# Patient Record
Sex: Male | Born: 1970
Health system: Southern US, Community
[De-identification: ages and names within clinical notes are randomized; demographics above are authoritative.]

## PROBLEM LIST (undated history)

## (undated) DIAGNOSIS — R61 Generalized hyperhidrosis: Secondary | ICD-10-CM

## (undated) HISTORY — PX: NO PAST SURGERIES: SHX2092

## (undated) HISTORY — DX: Generalized hyperhidrosis: R61

---

## 2004-09-21 ENCOUNTER — Ambulatory Visit: Payer: Self-pay | Admitting: Internal Medicine

## 2004-10-07 ENCOUNTER — Ambulatory Visit: Payer: Self-pay | Admitting: Internal Medicine

## 2004-11-08 ENCOUNTER — Ambulatory Visit: Payer: Self-pay | Admitting: Internal Medicine

## 2005-02-04 ENCOUNTER — Ambulatory Visit: Payer: Self-pay | Admitting: Internal Medicine

## 2005-08-18 ENCOUNTER — Ambulatory Visit: Payer: Self-pay | Admitting: Internal Medicine

## 2006-02-13 ENCOUNTER — Ambulatory Visit: Payer: Self-pay | Admitting: Pulmonary Disease

## 2006-03-13 ENCOUNTER — Ambulatory Visit: Payer: Self-pay | Admitting: Internal Medicine

## 2007-01-29 ENCOUNTER — Ambulatory Visit: Payer: Self-pay | Admitting: Internal Medicine

## 2007-02-26 ENCOUNTER — Ambulatory Visit: Payer: Self-pay | Admitting: Internal Medicine

## 2007-05-09 ENCOUNTER — Encounter: Payer: Self-pay | Admitting: Internal Medicine

## 2007-05-09 DIAGNOSIS — J309 Allergic rhinitis, unspecified: Secondary | ICD-10-CM | POA: Insufficient documentation

## 2007-08-16 ENCOUNTER — Ambulatory Visit: Payer: Self-pay | Admitting: Thoracic Surgery

## 2007-09-24 ENCOUNTER — Telehealth: Payer: Self-pay | Admitting: Internal Medicine

## 2007-09-24 ENCOUNTER — Emergency Department (HOSPITAL_COMMUNITY): Admission: EM | Admit: 2007-09-24 | Discharge: 2007-09-24 | Payer: Self-pay | Admitting: Family Medicine

## 2007-10-11 ENCOUNTER — Ambulatory Visit: Payer: Self-pay | Admitting: Thoracic Surgery

## 2008-05-28 ENCOUNTER — Ambulatory Visit: Payer: Self-pay | Admitting: Internal Medicine

## 2008-05-29 DIAGNOSIS — E785 Hyperlipidemia, unspecified: Secondary | ICD-10-CM | POA: Insufficient documentation

## 2009-07-16 ENCOUNTER — Ambulatory Visit: Payer: Self-pay | Admitting: Internal Medicine

## 2009-07-16 DIAGNOSIS — M6740 Ganglion, unspecified site: Secondary | ICD-10-CM | POA: Insufficient documentation

## 2010-01-20 ENCOUNTER — Ambulatory Visit: Payer: Self-pay | Admitting: Internal Medicine

## 2010-01-20 DIAGNOSIS — M25529 Pain in unspecified elbow: Secondary | ICD-10-CM | POA: Insufficient documentation

## 2010-10-12 NOTE — Miscellaneous (Signed)
Summary: [facility] El[facility]  [facility]   Imported By: Sherian Rein 01/22/2010 11:11:37  _____________________________________________________________________  External Attachment:    Type:   Image     Comment:   External Document

## 2010-10-12 NOTE — Assessment & Plan Note (Signed)
Summary: bump left elbow/#/cd   Vital Signs:  Patient profile:   40 year old male Height:      75 inches Weight:      273.13 pounds BMI:     34.26 O2 Sat:      97 % on Room air Temp:     97.4 degrees F oral Pulse rate:   81 / minute BP sitting:   110 / 64  (left arm) Cuff size:   large  Vitals Entered By: Lucious Groves (Jan 20, 2010 9:41 AM)  O2 Flow:  Room air  Procedure Note  Injections: The patient complains of pain and inflammation. Duration of symptoms: 1 week Indication: acute pain Consent signed: yes  Procedure # 1: joint aspiration & injection    Region: dorsal    Location: L elbow    Technique: 20 g needle    Medication: 20 mg depomedrol    Anesthesia: 1.0 ml 1% lidocaine w/o epinephrine    Comment: Risks including but not limited by incomplete procedure, bleeding, infection, recurrence were discussed with the patient. Consent form was signed. Tolerated well. Complicatons - none. Good pain relief following the procedure. The tap was dry.  Cleaned and prepped with: alcohol and betadine Wound dressing: bandaid Instructions: elevate and ice  CC: C/O knot/bump on left elbow x1 week. /kb Is Patient Diabetic? No Pain Assessment Patient in pain? no      Comments Patient notes that it does not limit his movement, and he does not have pain with the elbow unless he puts pressure on it./kb   CC:  C/O knot/bump on left elbow x1 week. /kb.  History of Present Illness: C/o L elbow pain and swelling x 1 wk  Current Medications (verified): 1)  Vitamin D3 1000 Unit  Tabs (Cholecalciferol) .Marland Kitchen.. 1 By Mouth Daily 2)  Pennsaid 1.5 % Soln (Diclofenac Sodium) .Marland Kitchen.. 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    Physical Exam  General:  Well-developed,well-nourished,in no acute distress; alert,appropriate and cooperative throughout examination Msk:  L elbow bursa is swollen and tender a little Skin:  not red   Impression & Recommendations:  Problem # 1:  ELBOW PAIN (ICD-719.42) L - bursitis Assessment New See "Patient Instructions".  Orders: Joint Aspirate / Injection, Intermediate (16109) Depo-Medrol 20mg  (J1020) Ace Wraps 3-5 in/yard  (U0454)  Complete Medication List: 1)  Vitamin D3 1000 Unit Tabs (Cholecalciferol) .Marland Kitchen.. 1 by mouth daily 2)  Pennsaid 1.5 % Soln (Diclofenac sodium) .Marland Kitchen.. 1-2 gtt to skin three times daily 3)  Tramadol Hcl 50 Mg Tabs (Tramadol hcl) .Marland Kitchen.. 1-2 tabs by mouth two times a day as needed pain 4)  Gas Relief Extra Strength 125 Mg Caps (Simethicone) .Marland Kitchen.. 1-2 by mouth qid as needed gas 5)  Claritin-d 12 Hour 5-120 Mg Xr12h-tab (Loratadine-pseudoephedrine) .Marland Kitchen.. 1 by mouth two times a day as needed congestion  Patient Instructions: 1)  Naprelan 1 a day w/food untill gone 2)  Call if you are not better in a reasonable amount of time or if worse.  Prescriptions: PENNSAID 1.5 % SOLN (DICLOFENAC SODIUM) 1-2 gtt to skin three times daily  #1 x 1   Entered and Authorized by:   Tresa Garter MD   Signed by:  Tresa Garter MD on 01/20/2010   Method used:   Print then Give to Patient   RxID:   [id number] VITAMIN D3 1000 UNIT  TABS (CHOLECALCIFEROL) 1 by mouth daily  #100 x 3   Entered and Authorized by:   Tresa Garter MD   Signed by:   Tresa Garter MD on 01/20/2010   Method used:   Print then Give to Patient   RxID:   [id number] CLARITIN-D 12 HOUR 5-120 MG XR12H-TAB (LORATADINE-PSEUDOEPHEDRINE) 1 by mouth two times a day as needed congestion  #60 x 3   Entered and Authorized by:   Tresa Garter MD   Signed by:   Tresa Garter MD on 01/20/2010   Method used:   Print then Give to Patient   RxID:    [id number] GAS RELIEF EXTRA STRENGTH 125 MG CAPS (SIMETHICONE) 1-2 by mouth qid as needed gas  #60 x 6   Entered and Authorized by:   Tresa Garter MD   Signed by:   Tresa Garter MD on 01/20/2010   Method used:   Print then Give to Patient   RxID:   [id number] TRAMADOL HCL 50 MG TABS (TRAMADOL HCL) 1-2 tabs by mouth two times a day as needed pain  #60 x 1   Entered and Authorized by:   Tresa Garter MD   Signed by:   Tresa Garter MD on 01/20/2010   Method used:   Print then Give to Patient   RxID:   [id number]

## 2011-01-25 NOTE — Assessment & Plan Note (Signed)
OFFICE VISIT   Hector Lee, Hector Lee  DOB:  [birth year]                                        August 16, 2007  CHART #:  [id number]   The patient was first seen in March two years ago where he consulted [place]  for a dorsal sympathectomy for hypoidrosis.  Apparently did not want to  have the surgery here and referred himself to [facility] where he underwent  bilateral dorsal sympathectomy.  He initially said that he had a good  result, but now has developed a recurrence on the left side with  continue hypoidrosis, some mild hyperhidrosis on the right side.  He  comes here for evaluation for a possible recurrent surgery on the left  side.  I asked him why he did not go down to [facility] and he said for  financial reasons.  At the time we originally talked to him, we  explained our philosophy for sympathectomy and the best I can tell at  [facility] he had just obliteration and not resection.  He does not know the  surgeon, but we will have him sign to get the operative report and  medical records before making a final decision to schedule him for redo  surgery.   PHYSICAL EXAMINATION:  VITAL SIGNS:  His blood pressure was 154/80,  pulse 78, respirations 18, and saturations were 98%.  HEART:  Regular sinus rhythm, no murmur.   I will give him a call we have discussed the situation after receiving  the medical records.   Ines Bloomer, M.D.  Electronically Signed   DPB/MEDQ  D:  08/16/2007  T:  08/17/2007  Job:  [id number]

## 2011-01-25 NOTE — Assessment & Plan Note (Signed)
OFFICE VISIT   FOXX, KLARICH  DOB:  Feb 03, 1971                                        October 11, 2007  CHART #:  [id number]   Mr. Dunnaway came for follow-up and final discussions for his possible  and another dorsal sympathectomy to me for hyperhidrosis.  I reviewed  his records at [facility] and he still says he had more sweating on the left  side, but he has had two surgeries on the left side at [facility], so this  will be the third time and I explained to him that the chances of [place]  getting a good result after two previous surgeries would not be good.  I  also feel that he is not having that much trouble with hyperhidrosis on  the left side and that he does not constantly have sweaty palms on the  left side, although it is a little more moist than the right side.  For  this reason I feel that the risk/benefit ratio is such that I would not  recommend surgery and I explained this to him.  I believe he understands  this.  I told him he might want to go back and discuss the situation  again with [facility] at [facility].   Ines Bloomer, M.D.  Electronically Signed   DPB/MEDQ  D:  10/11/2007  T:  10/12/2007  Job:  [id number]

## 2012-10-08 ENCOUNTER — Encounter: Payer: Self-pay | Admitting: Internal Medicine

## 2012-10-08 DIAGNOSIS — Z0289 Encounter for other administrative examinations: Secondary | ICD-10-CM

## 2012-11-01 ENCOUNTER — Encounter: Payer: Self-pay | Admitting: Internal Medicine

## 2013-04-05 ENCOUNTER — Encounter: Payer: Self-pay | Admitting: Internal Medicine

## 2013-04-05 ENCOUNTER — Ambulatory Visit (INDEPENDENT_AMBULATORY_CARE_PROVIDER_SITE_OTHER): Payer: Managed Care, Other (non HMO) | Admitting: Internal Medicine

## 2013-04-05 VITALS — BP 140/80 | HR 80 | Temp 99.3°F | Resp 16 | Ht 75.0 in | Wt 314.0 lb

## 2013-04-05 DIAGNOSIS — R030 Elevated blood-pressure reading, without diagnosis of hypertension: Secondary | ICD-10-CM

## 2013-04-05 DIAGNOSIS — IMO0001 Reserved for inherently not codable concepts without codable children: Secondary | ICD-10-CM | POA: Insufficient documentation

## 2013-04-05 DIAGNOSIS — E669 Obesity, unspecified: Secondary | ICD-10-CM

## 2013-04-05 DIAGNOSIS — Z0000 Encounter for general adult medical examination without abnormal findings: Secondary | ICD-10-CM | POA: Insufficient documentation

## 2013-04-05 DIAGNOSIS — E785 Hyperlipidemia, unspecified: Secondary | ICD-10-CM

## 2013-04-05 NOTE — Assessment & Plan Note (Signed)
NAS diet Loose wt 

## 2013-04-05 NOTE — Assessment & Plan Note (Signed)
Labs

## 2013-04-05 NOTE — Assessment & Plan Note (Signed)
nutr ref

## 2013-04-05 NOTE — Progress Notes (Signed)
  Subjective:    Patient ID: Hector Lee, male    DOB: February 12, 1971, 42 y.o.   MRN: [id number]  HPI  The patient is here for a wellness exam. The patient has been doing well overall without major physical or psychological issues going on lately.  Wt Readings from Last 3 Encounters:  04/05/13 314 lb (142.429 kg)  01/20/10 273 lb 2.1 oz (123.892 kg)  07/16/09 284 lb (128.822 kg)   BP Readings from Last 3 Encounters:  04/05/13 140/80  01/20/10 110/64  07/16/09 124/82     Review of Systems  Constitutional: Negative for appetite change, fatigue and unexpected weight change.  HENT: Negative for ear pain, nosebleeds, congestion, sore throat, sneezing, trouble swallowing, neck pain, dental problem and ear discharge.   Eyes: Negative for itching and visual disturbance.  Respiratory: Negative for cough.   Cardiovascular: Negative for chest pain, palpitations and leg swelling.  Gastrointestinal: Negative for nausea, diarrhea, blood in stool and abdominal distention.  Genitourinary: Negative for frequency and hematuria.  Musculoskeletal: Negative for back pain, joint swelling and gait problem.  Skin: Negative for rash.  Neurological: Negative for dizziness, tremors, speech difficulty and weakness.  Psychiatric/Behavioral: Negative for suicidal ideas, confusion, sleep disturbance, dysphoric mood and agitation. The patient is not nervous/anxious.        Objective:   Physical Exam  Constitutional: He is oriented to person, place, and time. He appears well-developed. No distress.  NAD  HENT:  Mouth/Throat: Oropharynx is clear and moist.  Eyes: Conjunctivae are normal. Pupils are equal, round, and reactive to light. Left eye exhibits no discharge. No scleral icterus.  Neck: Normal range of motion. No JVD present. No thyromegaly present.  Cardiovascular: Normal rate, regular rhythm, normal heart sounds and intact distal pulses.  Exam reveals no gallop and no friction rub.   No murmur  heard. Pulmonary/Chest: Effort normal and breath sounds normal. No respiratory distress. He has no wheezes. He has no rales. He exhibits no tenderness.  Abdominal: Soft. Bowel sounds are normal. He exhibits no distension and no mass. There is no tenderness. There is no rebound and no guarding.  Musculoskeletal: Normal range of motion. He exhibits no edema and no tenderness.  Lymphadenopathy:    He has no cervical adenopathy.  Neurological: He is alert and oriented to person, place, and time. He has normal reflexes. No cranial nerve deficit. He exhibits normal muscle tone. He displays a negative Romberg sign. Coordination and gait normal.  No meningeal signs  Skin: Skin is warm and dry. No rash noted. No pallor.  Psychiatric: He has a normal mood and affect. His behavior is normal. Judgment and thought content normal.    No results found for this basename: WBC, HGB, HCT, PLT, GLUCOSE, CHOL, TRIG, HDL, LDLDIRECT, LDLCALC, ALT, AST, NA, K, CL, CREATININE, BUN, CO2, TSH, PSA, INR, GLUF, HGBA1C, MICROALBUR         Assessment & Plan:

## 2013-04-05 NOTE — Assessment & Plan Note (Signed)
We discussed age appropriate health related issues, including available/recomended screening tests and vaccinations. We discussed a need for adhering to healthy diet and exercise. Labs/EKG were reviewed/ordered. All questions were answered.   

## 2013-04-08 ENCOUNTER — Ambulatory Visit: Payer: Self-pay | Admitting: Internal Medicine

## 2013-04-09 ENCOUNTER — Other Ambulatory Visit (INDEPENDENT_AMBULATORY_CARE_PROVIDER_SITE_OTHER): Payer: Managed Care, Other (non HMO)

## 2013-04-09 DIAGNOSIS — Z0000 Encounter for general adult medical examination without abnormal findings: Secondary | ICD-10-CM

## 2013-04-09 LAB — URINALYSIS
Bilirubin Urine: NEGATIVE
Hgb urine dipstick: NEGATIVE
Ketones, ur: NEGATIVE
Leukocytes, UA: NEGATIVE
Urine Glucose: NEGATIVE
Urobilinogen, UA: 0.2 (ref 0.0–1.0)

## 2013-04-09 LAB — CBC WITH DIFFERENTIAL/PLATELET
Basophils Absolute: 0 10*3/uL (ref 0.0–0.1)
Basophils Relative: 0.3 % (ref 0.0–3.0)
Eosinophils Absolute: 0.2 10*3/uL (ref 0.0–0.7)
Hemoglobin: 14.9 g/dL (ref 13.0–17.0)
Lymphs Abs: 2.2 10*3/uL (ref 0.7–4.0)
MCHC: 33.2 g/dL (ref 30.0–36.0)
MCV: 88.4 fl (ref 78.0–100.0)
Monocytes Absolute: 0.7 10*3/uL (ref 0.1–1.0)
Neutro Abs: 3 10*3/uL (ref 1.4–7.7)
RBC: 5.07 Mil/uL (ref 4.22–5.81)
RDW: 14.9 % — ABNORMAL HIGH (ref 11.5–14.6)

## 2013-04-09 LAB — LIPID PANEL
Cholesterol: 237 mg/dL — ABNORMAL HIGH (ref 0–200)
HDL: 37 mg/dL — ABNORMAL LOW (ref >39.00)
VLDL: 25.8 mg/dL (ref 0.0–40.0)

## 2013-04-09 LAB — HEPATIC FUNCTION PANEL
Albumin: 4.2 g/dL (ref 3.5–5.2)
Total Protein: 7.8 g/dL (ref 6.0–8.3)

## 2013-04-09 LAB — BASIC METABOLIC PANEL
Chloride: 103 mEq/L (ref 96–112)
Potassium: 4.2 mEq/L (ref 3.5–5.1)

## 2013-04-09 LAB — TSH: TSH: 1.25 u[IU]/mL (ref 0.35–5.50)

## 2013-04-12 ENCOUNTER — Other Ambulatory Visit: Payer: Self-pay | Admitting: *Deleted

## 2013-04-12 DIAGNOSIS — E785 Hyperlipidemia, unspecified: Secondary | ICD-10-CM

## 2013-04-24 ENCOUNTER — Telehealth: Payer: Self-pay | Admitting: Internal Medicine

## 2013-04-24 NOTE — Telephone Encounter (Signed)
The form was faxed on July 30.  I re-faxed it today.  Pt is aware.

## 2013-04-24 NOTE — Telephone Encounter (Signed)
Pt left a form at his last appt.  He needs it faxed to his insurance for his discount.  It must be sent in by the end of this month.  He gave the form to Dr. Posey Rea.  Has it been faxed to [facility] yet?

## 2013-06-15 ENCOUNTER — Emergency Department (HOSPITAL_COMMUNITY)
Admission: EM | Admit: 2013-06-15 | Discharge: 2013-06-15 | Disposition: A | Discharge status: Home / Self Care | Payer: Managed Care, Other (non HMO) | Source: Home / Self Care | Attending: Family Medicine | Admitting: Family Medicine

## 2013-06-15 ENCOUNTER — Encounter (HOSPITAL_COMMUNITY): Payer: Self-pay | Admitting: Emergency Medicine

## 2013-06-15 DIAGNOSIS — R21 Rash and other nonspecific skin eruption: Secondary | ICD-10-CM

## 2013-06-15 MED ORDER — TRIAMCINOLONE ACETONIDE 0.1 % EX CREA: TOPICAL_CREAM | Freq: Two times a day (BID) | CUTANEOUS | Status: DC

## 2013-06-15 NOTE — ED Notes (Signed)
Pt c/o rash onset 2 weeks on RLQ Sxs include: itchiness Denies: fevers Has tried OTC topical creams w/no relief Alert w/no signs of acute distress.

## 2013-06-15 NOTE — ED Provider Notes (Signed)
Hector Lee is a 42 y.o. male who presents to Urgent Care today for right anterior abdominal wall macular itchy rash present off and on for the last 2 weeks. Patient tried over-the-counter hydrocortisone cream which has helped a bit. No fevers or chills. No flulike illness. No recent medications. No involvement elsewhere. No injury.   Past Medical History  Diagnosis Date  . Hyperhydrosis disorder    History  Substance Use Topics  . Smoking status: Never Smoker   . Smokeless tobacco: Not on file  . Alcohol Use: Yes   ROS as above Medications reviewed. No current facility-administered medications for this encounter.   Current Outpatient Prescriptions  Medication Sig Dispense Refill  . triamcinolone cream (KENALOG) 0.1 % Apply topically 2 (two) times daily.  60 g  0    Exam:  BP 118/76  Pulse 79  Temp(Src) 98.6 F (37 C) (Oral)  Resp 16  SpO2 100% Gen: Well NAD SKIN: Hypopigmented macular patch approximately 3 x 2 cm oval in shape on the lower right abdominal wall. Nontender to the touch. Nonblanching.  No results found for this or any previous visit (from the past 24 hour(s)). No results found.  Assessment and Plan: 42 y.o. male with likely contact dermatitis. However this is difficult to ascertain as patient has used hydrocortisone cream which masks the appearance of the rash. Plan to treat with triamcinolone cream. If not improved followup with primary care provider. Discussed warning signs or symptoms. Please see discharge instructions. Patient expresses understanding.      Rodolph Bong, MD 06/15/13 [phone]

## 2016-02-13 ENCOUNTER — Inpatient Hospital Stay (HOSPITAL_COMMUNITY)
Admission: EM | Admit: 2016-02-13 | Discharge: 2016-02-16 | DRG: 854 | Disposition: A | Discharge status: Home / Self Care | Payer: BLUE CROSS/BLUE SHIELD | Attending: Internal Medicine | Admitting: Internal Medicine

## 2016-02-13 ENCOUNTER — Encounter (HOSPITAL_COMMUNITY): Payer: Self-pay | Admitting: Emergency Medicine

## 2016-02-13 ENCOUNTER — Emergency Department (HOSPITAL_COMMUNITY): Payer: BLUE CROSS/BLUE SHIELD

## 2016-02-13 DIAGNOSIS — A419 Sepsis, unspecified organism: Secondary | ICD-10-CM | POA: Diagnosis not present

## 2016-02-13 DIAGNOSIS — N189 Chronic kidney disease, unspecified: Secondary | ICD-10-CM

## 2016-02-13 DIAGNOSIS — J0120 Acute ethmoidal sinusitis, unspecified: Secondary | ICD-10-CM | POA: Diagnosis present

## 2016-02-13 DIAGNOSIS — N179 Acute kidney failure, unspecified: Secondary | ICD-10-CM | POA: Diagnosis not present

## 2016-02-13 DIAGNOSIS — E1122 Type 2 diabetes mellitus with diabetic chronic kidney disease: Secondary | ICD-10-CM | POA: Diagnosis present

## 2016-02-13 DIAGNOSIS — I10 Essential (primary) hypertension: Secondary | ICD-10-CM

## 2016-02-13 DIAGNOSIS — L03213 Periorbital cellulitis: Secondary | ICD-10-CM | POA: Diagnosis not present

## 2016-02-13 DIAGNOSIS — H578 Other specified disorders of eye and adnexa: Secondary | ICD-10-CM | POA: Diagnosis not present

## 2016-02-13 DIAGNOSIS — N182 Chronic kidney disease, stage 2 (mild): Secondary | ICD-10-CM | POA: Diagnosis present

## 2016-02-13 DIAGNOSIS — H40051 Ocular hypertension, right eye: Secondary | ICD-10-CM | POA: Diagnosis present

## 2016-02-13 DIAGNOSIS — R509 Fever, unspecified: Secondary | ICD-10-CM | POA: Diagnosis not present

## 2016-02-13 DIAGNOSIS — N289 Disorder of kidney and ureter, unspecified: Secondary | ICD-10-CM

## 2016-02-13 DIAGNOSIS — H05011 Cellulitis of right orbit: Secondary | ICD-10-CM | POA: Diagnosis present

## 2016-02-13 DIAGNOSIS — J0110 Acute frontal sinusitis, unspecified: Secondary | ICD-10-CM | POA: Diagnosis present

## 2016-02-13 LAB — I-STAT CHEM 8, ED
BUN: 16 mg/dL (ref 6–20)
Calcium, Ion: 1.21 mmol/L (ref 1.12–1.23)
Chloride: 102 mmol/L (ref 101–111)
Creatinine, Ser: 1.5 mg/dL — ABNORMAL HIGH (ref 0.61–1.24)
Glucose, Bld: 101 mg/dL — ABNORMAL HIGH (ref 65–99)
HCT: 46 % (ref 39.0–52.0)
Hemoglobin: 15.6 g/dL (ref 13.0–17.0)
Potassium: 4.4 mmol/L (ref 3.5–5.1)
Sodium: 141 mmol/L (ref 135–145)
TCO2: 29 mmol/L (ref 0–100)

## 2016-02-13 LAB — CBC WITH DIFFERENTIAL/PLATELET
Basophils Absolute: 0 10*3/uL (ref 0.0–0.1)
Basophils Relative: 0 %
Eosinophils Absolute: 0 10*3/uL (ref 0.0–0.7)
Eosinophils Relative: 0 %
HCT: 44.1 % (ref 39.0–52.0)
Hemoglobin: 14.1 g/dL (ref 13.0–17.0)
Lymphocytes Relative: 12 %
Lymphs Abs: 1.2 10*3/uL (ref 0.7–4.0)
MCH: 28.2 pg (ref 26.0–34.0)
MCHC: 32 g/dL (ref 30.0–36.0)
MCV: 88.2 fL (ref 78.0–100.0)
Monocytes Absolute: 1.2 10*3/uL — ABNORMAL HIGH (ref 0.1–1.0)
Monocytes Relative: 12 %
Neutro Abs: 7.7 10*3/uL (ref 1.7–7.7)
Neutrophils Relative %: 76 %
Platelets: 185 10*3/uL (ref 150–400)
RBC: 5 MIL/uL (ref 4.22–5.81)
RDW: 14.8 % (ref 11.5–15.5)
WBC: 10.1 10*3/uL (ref 4.0–10.5)

## 2016-02-13 MED ORDER — POLYETHYLENE GLYCOL 3350 17 G PO PACK: 17.0000 g | PACK | Freq: Every day | ORAL | Status: DC | PRN

## 2016-02-13 MED ORDER — SODIUM CHLORIDE 0.9 % IV BOLUS (SEPSIS)
1000.0000 mL | Freq: Once | INTRAVENOUS | Status: AC
  Administered 2016-02-13: 1000 mL via INTRAVENOUS

## 2016-02-13 MED ORDER — VANCOMYCIN HCL 10 G IV SOLR
2500.0000 mg | Freq: Once | INTRAVENOUS | Status: AC
  Administered 2016-02-13: 2500 mg via INTRAVENOUS
  Filled 2016-02-13: qty 2500

## 2016-02-13 MED ORDER — IOPAMIDOL (ISOVUE-300) INJECTION 61%
INTRAVENOUS | Status: AC
  Administered 2016-02-13: 75 mL via INTRAVENOUS
  Filled 2016-02-13: qty 75

## 2016-02-13 MED ORDER — HYDROCODONE-ACETAMINOPHEN 5-325 MG PO TABS
1.0000 | ORAL_TABLET | Freq: Four times a day (QID) | ORAL | Status: DC | PRN
  Administered 2016-02-13 – 2016-02-14 (×5): 1 via ORAL
  Administered 2016-02-14 – 2016-02-16 (×5): 2 via ORAL
  Filled 2016-02-13: qty 1
  Filled 2016-02-13: qty 2
  Filled 2016-02-13: qty 1
  Filled 2016-02-13: qty 2
  Filled 2016-02-13 (×2): qty 1
  Filled 2016-02-13 (×3): qty 2
  Filled 2016-02-13: qty 1

## 2016-02-13 MED ORDER — BISACODYL 5 MG PO TBEC: 5.0000 mg | DELAYED_RELEASE_TABLET | Freq: Every day | ORAL | Status: DC | PRN

## 2016-02-13 MED ORDER — ONDANSETRON HCL 4 MG PO TABS: 4.0000 mg | ORAL_TABLET | Freq: Four times a day (QID) | ORAL | Status: DC | PRN

## 2016-02-13 MED ORDER — DORZOLAMIDE HCL-TIMOLOL MAL 2-0.5 % OP SOLN
1.0000 [drp] | Freq: Two times a day (BID) | OPHTHALMIC | Status: DC
  Administered 2016-02-13 – 2016-02-16 (×7): 1 [drp] via OPHTHALMIC
  Filled 2016-02-13: qty 10

## 2016-02-13 MED ORDER — ONDANSETRON HCL 4 MG/2ML IJ SOLN: 4.0000 mg | Freq: Four times a day (QID) | INTRAMUSCULAR | Status: DC | PRN

## 2016-02-13 MED ORDER — VANCOMYCIN HCL IN DEXTROSE 1-5 GM/200ML-% IV SOLN
1000.0000 mg | Freq: Two times a day (BID) | INTRAVENOUS | Status: DC
  Administered 2016-02-14 – 2016-02-16 (×5): 1000 mg via INTRAVENOUS
  Filled 2016-02-13 (×6): qty 200

## 2016-02-13 MED ORDER — ACETAMINOPHEN 325 MG PO TABS
650.0000 mg | ORAL_TABLET | Freq: Four times a day (QID) | ORAL | Status: DC | PRN
  Administered 2016-02-13: 650 mg via ORAL
  Filled 2016-02-13: qty 2

## 2016-02-13 MED ORDER — ACETAMINOPHEN 650 MG RE SUPP: 650.0000 mg | Freq: Four times a day (QID) | RECTAL | Status: DC | PRN

## 2016-02-13 MED ORDER — SODIUM CHLORIDE 0.9 % IV SOLN
3.0000 g | Freq: Once | INTRAVENOUS | Status: AC
  Administered 2016-02-13: 3 g via INTRAVENOUS
  Filled 2016-02-13: qty 3

## 2016-02-13 MED ORDER — ENOXAPARIN SODIUM 40 MG/0.4ML ~~LOC~~ SOLN
40.0000 mg | SUBCUTANEOUS | Status: DC
  Administered 2016-02-13: 40 mg via SUBCUTANEOUS
  Filled 2016-02-13: qty 0.4

## 2016-02-13 MED ORDER — SODIUM CHLORIDE 0.9 % IV SOLN
1.5000 g | Freq: Four times a day (QID) | INTRAVENOUS | Status: DC
  Administered 2016-02-13 – 2016-02-16 (×12): 1.5 g via INTRAVENOUS
  Filled 2016-02-13 (×15): qty 1.5

## 2016-02-13 NOTE — Consult Note (Addendum)
Reason for Consult: Acute sinusitis and orbital cellulitis Referring Physician: Charlestine Nighthristopher Lawyer, PA-C  HPI:  Hector Lee is an 45 y.o. male male who [facility] today complaining of swollen right eye.  A few days ago patient developed some nasal congestion and scratchy voice. He has a history of environmental/seasonal allergies. Around 3 AM this morning, the patient woke up with discomfort in right eye. The right eye was markedly swollen. Denies recent trauma to the eye. He does have a slight frontal headache today.  Past Medical History  Diagnosis Date  . Hyperhydrosis disorder     Past Surgical History  Procedure Laterality Date  . No past surgeries      Family History  Problem Relation Age of Onset  . Diabetes      grandmother    Social History:  reports that he has never smoked. He does not have any smokeless tobacco history on file. He reports that he drinks alcohol. He reports that he does not use illicit drugs.  Allergies: No Known Allergies  Prior to Admission medications   Medication Sig Start Date End Date Taking? Authorizing Provider  aspirin-sod bicarb-citric acid (ALKA-SELTZER) 325 MG TBEF tablet Take 325 mg by mouth every 6 (six) hours as needed.   Yes Historical Provider, MD  tetrahydrozoline 0.05 % ophthalmic solution Place 1 drop into both eyes daily as needed (dry eyes).   Yes Historical Provider, MD    Medications:  I have reviewed the patient's current medications. Scheduled: . ampicillin-sulbactam (UNASYN) IV  1.5 g Intravenous Q6H  . dorzolamide-timolol  1 drop Right Eye BID  . enoxaparin (LOVENOX) injection  40 mg Subcutaneous Q24H  . [START ON 02/14/2016] vancomycin  1,000 mg Intravenous Q12H   WUJ:[id number]:acetaminophen **OR** acetaminophen, bisacodyl, HYDROcodone-acetaminophen, ondansetron **OR** ondansetron (ZOFRAN) IV, polyethylene glycol  Results for orders placed or performed during the hospital encounter of 02/13/16 (from the past 48  hour(s))  I-stat Chem 8, ED     Status: Abnormal   Collection Time: 02/13/16  8:44 AM  Result Value Ref Range   Sodium 141 135 - 145 mmol/L   Potassium 4.4 3.5 - 5.1 mmol/L   Chloride 102 101 - 111 mmol/L   BUN 16 6 - 20 mg/dL   Creatinine, Ser 1.301.50 (H) 0.61 - 1.24 mg/dL   Glucose, Bld 865101 (H) 65 - 99 mg/dL   Calcium, Ion 7.841.21 6.961.12 - 1.23 mmol/L   TCO2 29 0 - 100 mmol/L   Hemoglobin 15.6 13.0 - 17.0 g/dL   HCT 29.546.0 28.439.0 - 13.252.0 %  CBC with Differential     Status: Abnormal   Collection Time: 02/13/16  9:40 AM  Result Value Ref Range   WBC 10.1 4.0 - 10.5 K/uL   RBC 5.00 4.22 - 5.81 MIL/uL   Hemoglobin 14.1 13.0 - 17.0 g/dL   HCT 44.044.1 10.239.0 - 72.552.0 %   MCV 88.2 78.0 - 100.0 fL   MCH 28.2 26.0 - 34.0 pg   MCHC 32.0 30.0 - 36.0 g/dL   RDW 36.614.8 44.011.5 - 34.715.5 %   Platelets 185 150 - 400 K/uL   Neutrophils Relative % 76 %   Neutro Abs 7.7 1.7 - 7.7 K/uL   Lymphocytes Relative 12 %   Lymphs Abs 1.2 0.7 - 4.0 K/uL   Monocytes Relative 12 %   Monocytes Absolute 1.2 (H) 0.1 - 1.0 K/uL   Eosinophils Relative 0 %   Eosinophils Absolute 0.0 0.0 - 0.7 K/uL  Basophils Relative 0 %   Basophils Absolute 0.0 0.0 - 0.1 K/uL    Ct Orbits W/cm  02/13/2016  CLINICAL DATA:  Right periorbital swelling. Nasal and sinus congestion. EXAM: CT ORBITS WITH CONTRAST TECHNIQUE: Multidetector CT imaging of the orbits was performed following the bolus administration of intravenous contrast. CONTRAST:  75 mL Isovue-300 COMPARISON:  None. FINDINGS: The right frontal sinus and anterior right ethmoid air cells are completely opacified. There is osseous dehiscence involving the inferolateral wall of the right frontal sinus and lamina papyracea. Soft tissue and stranding extends from the sinuses into the superior and medial extraconal space of the right orbit. No organized abscess is identified. There is mild right proptosis with mild stretching of the right optic nerve. There is also mild-to-moderate right preseptal  periorbital soft tissue swelling. Moderate, partially polypoid left maxillary sinus mucosal thickening is partially visualized. There is also mild left frontal, left ethmoid, and bilateral sphenoid sinus mucosal thickening. No sinus air-fluid levels are present. There is at most a trace left mastoid effusion. The visualized right mastoid air cells are clear. The visualized portion of the brain is unremarkable. No epidural collection is identified adjacent to the right-sided sinus disease. IMPRESSION: Right orbital postseptal cellulitis from extension of right frontal and right ethmoid sinus disease. No orbital abscess identified. These results were called by telephone at the time of interpretation on 02/13/2016 at 9:17 am to Apolonio Schneiders, who verbally acknowledged these results. Electronically Signed   By: Sebastian Ache M.D.   On: 02/13/2016 09:20   Review of Systems: As per HPI, otherwise 10 point review of systems negative.   Blood pressure 150/78, pulse 82, temperature 99.9 F (37.7 C), temperature source Oral, resp. rate 18, height 6\' 3"  (1.905 m), weight 136.079 kg (300 lb), SpO2 100 %.  General: Alert and oriented. Eyes: Marked edema of right upper lid. Left pupil, conjunctiva and lid normal. Vision grossly intact bilaterally. PERRL, slightly dimished right extraocular motion. Ears: Normal auricles and EACs. Nose: Moist and congested mucosa bilaterally. Mouth: Mucous membranes are moist. Posterior pharynx clear of any exudate or lesions.Normal dentition.  Neck: normal, supple, no masses Respiratory: clear to auscultation bilaterally, no wheezing, no crackles. Normal respiratory effort. No accessory muscle use.  Cardiovascular: Regular rate and rhythm.   Musculoskeletal: no clubbing / cyanosis. No joint deformity upper and lower extremities. Good ROM, no contractures. Normal muscle tone.  Skin: no rashes, lesions, ulcers.  Neurologic: CN 2-12 grossly intact. Sensation intact, Strength 5/5  in all 4.  Psychiatric: Normal judgment and insight. Alert and oriented x 3. Normal mood.   Procedure:  Flexible Fiberoptic Nasal Endoscopy Anesthesia: Topical oxymetazoline and lidocaine Description: Risks, benefits, and alternatives of flexible endoscopy were explained to the patient.  Specific mention was made of the risk of throat numbness with difficulty swallowing, possible bleeding from the nose and mouth, and pain from the procedure.  The patient gave oral consent to proceed.  The nasal cavities were decongested and anesthetised with a combination of oxymetazoline and 4% lidocaine solution.  The flexible scope was inserted into the right nasal cavity.  Endoscopy of the inferior and middle meatus was performed.  No polyp, mass, or lesion was appreciated. Purulent drainage noted. Olfactory cleft was clear.  Nasopharynx was clear.  Turbinates were without mass.  The procedure was repeated on the contralateral side. No purulent drainage on the left.  The patient tolerated the procedure well.  Instructions were given to avoid eating or drinking for 2 hours.  Assessment/Plan: Right orbital cellulitis, secondary to acute right frontal and ethmoid sinusitis. No obvious abscess on the CT scan. Agree with IV abx. Will follow.  Hector Lee,SUI W 02/13/2016, 10:24 PM

## 2016-02-13 NOTE — ED Notes (Signed)
MD Merrell at the bedside  

## 2016-02-13 NOTE — Consult Note (Signed)
Ophthalmology Initial Consult Note  Noble, Cicalese, 45 y.o. male Date of Service:  02/13/2016  Requesting physician: No att. providers found  Information Obtained from: patient Chief Complaint:  Right eyelid swellign  HPI/Discussion:  Hector Lee is a 45 y.o. male with longstanding sinus disease who presents with rapidly progressing right eyelid swelling and blurry vision OD over the past 24 hours. He underwent formal imaging upon arrival to the ED, which was notable for sinus disease as well as definite post-septal extension of infection on the right. He was started empirically on vanc/Unasyn and admitted for further evaluation. Ophthalmology was consulted to evaluate the patient.  Past Ocular Hx:  Denies Ocular Meds:  None Family ocular history: Denies  Past Medical History  Diagnosis Date  . Hyperhydrosis disorder    History reviewed. No pertinent past surgical history.  Prior to Admission Meds: Prescriptions prior to admission  Medication Sig Dispense Refill Last Dose  . aspirin-sod bicarb-citric acid (ALKA-SELTZER) 325 MG TBEF tablet Take 325 mg by mouth every 6 (six) hours as needed.   Past Month at Unknown time  . tetrahydrozoline 0.05 % ophthalmic solution Place 1 drop into both eyes daily as needed (dry eyes).   Past Month at Unknown time    Inpatient Meds: @  No Known Allergies Social History  Substance Use Topics  . Smoking status: Never Smoker   . Smokeless tobacco: Not on file  . Alcohol Use: Yes   Family History  Problem Relation Age of Onset  . Diabetes      grandmother    ROS: Other than ROS in the HPI, all other systems were negative.  Exam: Temp: (!) 100.4 F (38 C) Pulse Rate: 82 BP: (!) 150/78 mmHg Resp: 18 SpO2: 100 %  Visual Acuity:  near   OD 20/200   OS 20/25      OD OS  Confr Vis Fields Grossly full, but difficult to evaluate Full  EOM (Primary) -3 limitation in all gazes Full  Lids/Lashes 3+ upper and lower lid  edema Normal  Conjunctiva 2-3+ diffuse chemosis White, quiet  Adnexa  Normal  Normal  Pupils  4 --> 2, somewhat sluggish but reactive, trace rAPD 4 --> 2, brisk, no rAPD  Cornea  Clear Clear  Anterior Chamber Formed Formed  Lens:  Clear Clear  IOP (tonopen) 41 19  Fundus - Dilated? YES   Optic Disc - C:D Ratio 0.4 0.4                     Appearance  Pink, sharp margins, no edema Pink, sharp margins, no edema  Post Seg:  Retina                    Vessels Normal caliber Normal caliber                  Vitreous  Clear Clear                  Macula Good foveal reflex Good foveal reflex                  Periphery No holes or tears No holes or tears       Neuro:  Oriented to person, place, and time:  Yes Psychiatric:  Mood and Affect Appropriate:  Yes  Labs/imaging:  CT orbits: The right frontal sinus and anterior right ethmoid air cells are completely opacified. There is osseous dehiscence involving the inferolateral wall of  the right frontal sinus and lamina papyracea. Soft tissue and stranding extends from the sinuses into the superior and medial extraconal space of the right orbit. No organized abscess is identified. There is mild right proptosis with mild stretching of the right optic nerve. There is also mild-to-moderate right preseptal periorbital soft tissue swelling.  Moderate, partially polypoid left maxillary sinus mucosal thickening is partially visualized. There is also mild left frontal, left ethmoid, and bilateral sphenoid sinus mucosal thickening. No sinus air-fluid levels are present. There is at most a trace left mastoid effusion. The visualized right mastoid air cells are clear. The visualized portion of the brain is unremarkable. No epidural collection is identified adjacent to the right-sided sinus disease.  A/P:  45 y.o. male with history of sinus disease presents with 1-day history of rapidly progressive swelling right eyelids --> right orbital  cellulitis.  1) Right orbital cellulitis - Likely extension of underlying sinus disease. - There is obvious clinical and radiographic evidence of post-septal extension. - No clear abscess on my read or formal read. - Recommend broad-spectrum IV antibiotics (agree with vanc/Unasyn) with close observation. - Recommend treating IOP, as it is elevated 2/2 compression --> will start Cosopt BID OD.  2) Ocular hypertension OD - As above.  I will reevaluate the patient tomorrow for clinical improvement/worsening. If there is any concern for worsening, I would recommend repeat imaging to better evaluate for an abscess. If an abscess was at that point noted and in the implied context of worsening, I would [facility] by an Public librarianoculoplastics specialist. In the interim, I would definitely recommend formal ENT or plastics consult (depending on who covers these matters).  Baldwin Jamaica Scott Myelle Poteat, MD 02/13/2016, 1:44 PM

## 2016-02-13 NOTE — ED Notes (Signed)
Pt being transported upstairs by Marchelle FolksAmanda, EMT

## 2016-02-13 NOTE — H&P (Signed)
History and Physical    Hector Lee Houseworth [id number] DOB: 08-Jul-1971 DOA: 02/13/2016  PCP: Hector BarreJames John, MD  Patient coming from:    home    Chief Complaint: Right eye swelling  HPI: Hector Lee is a 45 y.o. male with no significant past medical history . A few days ago patient developed some nasal congestion, scratchy voice. Yesterday wife noticed right eye was slightly red which is not unusual for patient. He does give a history of environmental/seasonal allergies. Around 3 AM patient woke up with discomfort in right eye. The right eye was markedly swollen. Denies recent trauma to the eye. He does have a slight frontal headache today.  ED Course:  Temp 100.6, hemodynamically stable Normal white count CT of orbit shows right orbital post-septal cellulitis from extension of right frontal and right ethmoid sinus disease. Unasyn and vancomycin 1000 mL normal saline bolus  Review of Systems: As per HPI, otherwise 10 point review of systems negative.    Past Medical History  Diagnosis Date  . Hyperhydrosis disorder    PSH: no surgeries   reports that he has never smoked. He does not have any smokeless tobacco history on file. He reports that he drinks alcohol. He reports that he does not use illicit drugs.  No Known Allergies  FMH: diabetes in grandmother  Prior to Admission medications   Medication Sig Start Date End Date Taking? Authorizing Provider  aspirin-sod bicarb-citric acid (ALKA-SELTZER) 325 MG TBEF tablet Take 325 mg by mouth every 6 (six) hours as needed.   Yes Historical Provider, MD  tetrahydrozoline 0.05 % ophthalmic solution Place 1 drop into both eyes daily as needed (dry eyes).   Yes Historical Provider, MD   Physical Exam: Filed Vitals:   02/13/16 0930 02/13/16 1000 02/13/16 1017 02/13/16 1051  BP: 143/84 151/75  145/83  Pulse: 76 93  88  Temp:    100.6 F (38.1 C)  TempSrc:    Oral  Resp:    18  Weight:   136.079 kg (300 lb)   SpO2: 95% 97%   99%    Constitutional:  Pleasant, well-developed black male in NAD, calm, comfortable Filed Vitals:   02/13/16 0930 02/13/16 1000 02/13/16 1017 02/13/16 1051  BP: 143/84 151/75  145/83  Pulse: 76 93  88  Temp:    100.6 F (38.1 C)  TempSrc:    Oral  Resp:    18  Weight:   136.079 kg (300 lb)   SpO2: 95% 97%  99%   Eyes: Markedly edema of right upper lid . Occasional thin drainage coming from outer corner of eye . Unable to retract lid for adequate examination of eye  No surrounding lymphadenopathy. Left pupil, conjunctiva and lid normal.  ENMT: Mucous membranes are moist. Posterior pharynx clear of any exudate or lesions.Normal dentition.  Neck: normal, supple, no masses Respiratory: clear to auscultation bilaterally, no wheezing, no crackles. Normal respiratory effort. No accessory muscle use.  Cardiovascular: Regular rate and rhythm, no murmurs / rubs / gallops. No extremity edema. 2+ pedal pulses. No carotid bruits.  Abdomen: no tenderness, no masses palpated. No hepatomegaly. Bowel sounds positive.  Musculoskeletal: no clubbing / cyanosis. No joint deformity upper and lower extremities. Good ROM, no contractures. Normal muscle tone.  Skin: no rashes, lesions, ulcers.  Neurologic: CN 2-12 grossly intact. Sensation intact, Strength 5/5 in all 4.  Psychiatric: Normal judgment and insight. Alert and oriented x 3. Normal mood.   Labs on Admission: I have personally  reviewed following labs and imaging studies  Urine analysis:    Component Value Date/Time   COLORURINE LT. YELLOW 04/09/2013 0812   APPEARANCEUR CLEAR 04/09/2013 0812   LABSPEC 1.015 04/09/2013 0812   PHURINE 6.0 04/09/2013 0812   GLUCOSEU NEGATIVE 04/09/2013 0812   HGBUR NEGATIVE 04/09/2013 0812   BILIRUBINUR NEGATIVE 04/09/2013 0812   KETONESUR NEGATIVE 04/09/2013 0812   UROBILINOGEN 0.2 04/09/2013 0812   NITRITE NEGATIVE 04/09/2013 0812   LEUKOCYTESUR NEGATIVE 04/09/2013 0812    Radiological Exams on  Admission: Ct Orbits W/cm  02/13/2016  CLINICAL DATA:  Right periorbital swelling. Nasal and sinus congestion. EXAM: CT ORBITS WITH CONTRAST TECHNIQUE: Multidetector CT imaging of the orbits was performed following the bolus administration of intravenous contrast. CONTRAST:  75 mL Isovue-300 COMPARISON:  None. FINDINGS: The right frontal sinus and anterior right ethmoid air cells are completely opacified. There is osseous dehiscence involving the inferolateral wall of the right frontal sinus and lamina papyracea. Soft tissue and stranding extends from the sinuses into the superior and medial extraconal space of the right orbit. No organized abscess is identified. There is mild right proptosis with mild stretching of the right optic nerve. There is also mild-to-moderate right preseptal periorbital soft tissue swelling. Moderate, partially polypoid left maxillary sinus mucosal thickening is partially visualized. There is also mild left frontal, left ethmoid, and bilateral sphenoid sinus mucosal thickening. No sinus air-fluid levels are present. There is at most a trace left mastoid effusion. The visualized right mastoid air cells are clear. The visualized portion of the brain is unremarkable. No epidural collection is identified adjacent to the right-sided sinus disease. IMPRESSION: Right orbital postseptal cellulitis from extension of right frontal and right ethmoid sinus disease. No orbital abscess identified. These results were called by telephone at the time of interpretation on 02/13/2016 at 9:17 am to Hector Lee, who verbally acknowledged these results. Electronically Signed   By: Sebastian Ache M.D.   On: 02/13/2016 09:20    Assessment/Plan   Active Problems:   Periorbital cellulitis of right eye   Right periorbital cellulitis.            -place in OBS - medical bed -supportive care with analgesics, IVF. -EDP has consulted ophthalmology as well as ENT, in the interim continue Unasyn and  vancomycin  started in the ED   DVT prophylaxis:   Lovenox Code Status:   Full code Family Communication:   none  Disposition Plan: Discharge home 24-48 hours              Consults called:    EDP consulted Opthalmology and ENT Admission status:  Observation - Medical bed  Willette Cluster NP [facility] Pager [phone]  If 7PM-7AM, please contact night-coverage [email or web address] Password [email or web address]  02/13/2016, 11:04 AM

## 2016-02-13 NOTE — Progress Notes (Signed)
Pharmacy Antibiotic Note  Hector Lee is a 45 y.o. male admitted on 02/13/2016 with cellulitis.  Pharmacy has been consulted for vancomycin dosing.  Day #1 of abx for cellulitis. Afebrile, WBC wnl. SCr elevated at 1.5, normalized CrCl ~1465ml/min.  Plan: Give vancomycin 2.5g IV x 1, then start vancomycin 1g IV Q12 Monitor clinical picture, renal function, VT at Css F/U C&S, abx deescalation / LOT      Temp (24hrs), Avg:99 F (37.2 C), Min:99 F (37.2 C), Max:99 F (37.2 C)   Recent Labs Lab 02/13/16 0844  CREATININE 1.50*    CrCl cannot be calculated (Unknown ideal weight.).    No Known Allergies  Antimicrobials this admission: Unasyn 6/3 >>  Vancomycin 6/3 >>   Dose adjustments this admission: n/a  Microbiology results: n/a  Thank you for allowing pharmacy to be a part of this patient's care.  Hector Lee, PharmD, BCPS Clinical Pharmacist Pager [phone] 02/13/2016 9:47 AM

## 2016-02-13 NOTE — ED Notes (Signed)
Consulting MD at the bedside.

## 2016-02-13 NOTE — ED Notes (Addendum)
Pt just woke up with swelling to R eye.  Reports nasal congestion and sore throat over the past few days.  Denies difficulty with vision.

## 2016-02-13 NOTE — ED Provider Notes (Signed)
CSN: [id number]     Arrival date & time 02/13/16  0417 History   First MD Initiated Contact with Patient 02/13/16 [phone]     Chief Complaint  Patient presents with  . eye swollen      (Consider location/radiation/quality/duration/timing/severity/associated sxs/prior Treatment) HPI Patient presents to the emergency department with right eye swelling that started this morning around 3 AM the patient states that last night he was having right eye irritation, states recently he has had an upper respiratory illness.  Patient states that he did not take any medications prior to arrival.  He states that there is tenderness to the eyelid.  Patient states that seems make the condition better. The patient denies chest pain, shortness of breath, headache,blurred vision, neck pain, fever, weakness, numbness, dizziness, anorexia, edema, abdominal pain, nausea, vomiting, diarrhea, rash, back pain, dysuria, hematemesis, bloody stool, near syncope, or syncope. Past Medical History  Diagnosis Date  . Hyperhydrosis disorder    History reviewed. No pertinent past surgical history. No family history on file. Social History  Substance Use Topics  . Smoking status: Never Smoker   . Smokeless tobacco: None  . Alcohol Use: Yes    Review of Systems  All other systems negative except as documented in the HPI. All pertinent positives and negatives as reviewed in the HPI.  Allergies  Review of patient's allergies indicates no known allergies.  Home Medications   Prior to Admission medications   Medication Sig Start Date End Date Taking? Authorizing Provider  aspirin-sod bicarb-citric acid (ALKA-SELTZER) 325 MG TBEF tablet Take 325 mg by mouth every 6 (six) hours as needed.   Yes Historical Provider, MD  tetrahydrozoline 0.05 % ophthalmic solution Place 1 drop into both eyes daily as needed (dry eyes).   Yes Historical Provider, MD   BP 151/75 mmHg  Pulse 93  Temp(Src) 99 F (37.2 C) (Oral)  Resp 16  Wt  136.079 kg  SpO2 97% Physical Exam  Constitutional: He is oriented to person, place, and time. He appears well-developed and well-nourished. No distress.  HENT:  Head: Normocephalic and atraumatic.  Mouth/Throat: Oropharynx is clear and moist.  Eyes: Pupils are equal, round, and reactive to light. Right eye exhibits no chemosis, no discharge and no exudate. No foreign body present in the right eye. Left eye exhibits no chemosis, no discharge and no exudate. No foreign body present in the left eye. Right conjunctiva is injected. No scleral icterus. Right eye exhibits abnormal extraocular motion. Right eye exhibits no nystagmus. Left eye exhibits normal extraocular motion and no nystagmus.    Neck: Normal range of motion. Neck supple.  Cardiovascular: Normal rate, regular rhythm and normal heart sounds.  Exam reveals no gallop and no friction rub.   No murmur heard. Pulmonary/Chest: Effort normal and breath sounds normal. No respiratory distress. He has no wheezes.  Abdominal: Soft. Bowel sounds are normal. He exhibits no distension. There is no tenderness.  Neurological: He is alert and oriented to person, place, and time. He exhibits normal muscle tone. Coordination normal.  Skin: Skin is warm and dry. No rash noted. No erythema.  Psychiatric: He has a normal mood and affect. His behavior is normal.  Nursing note and vitals reviewed.   ED Course  Procedures (including critical care time) Labs Review Labs Reviewed  CBC WITH DIFFERENTIAL/PLATELET - Abnormal; Notable for the following:    Monocytes Absolute 1.2 (*)    All other components within normal limits  I-STAT CHEM 8, ED - Abnormal; Notable  for the following:    Creatinine, Ser 1.50 (*)    Glucose, Bld 101 (*)    All other components within normal limits    Imaging Review Ct Orbits W/cm  02/13/2016  CLINICAL DATA:  Right periorbital swelling. Nasal and sinus congestion. EXAM: CT ORBITS WITH CONTRAST TECHNIQUE: Multidetector CT  imaging of the orbits was performed following the bolus administration of intravenous contrast. CONTRAST:  75 mL Isovue-300 COMPARISON:  None. FINDINGS: The right frontal sinus and anterior right ethmoid air cells are completely opacified. There is osseous dehiscence involving the inferolateral wall of the right frontal sinus and lamina papyracea. Soft tissue and stranding extends from the sinuses into the superior and medial extraconal space of the right orbit. No organized abscess is identified. There is mild right proptosis with mild stretching of the right optic nerve. There is also mild-to-moderate right preseptal periorbital soft tissue swelling. Moderate, partially polypoid left maxillary sinus mucosal thickening is partially visualized. There is also mild left frontal, left ethmoid, and bilateral sphenoid sinus mucosal thickening. No sinus air-fluid levels are present. There is at most a trace left mastoid effusion. The visualized right mastoid air cells are clear. The visualized portion of the brain is unremarkable. No epidural collection is identified adjacent to the right-sided sinus disease. IMPRESSION: Right orbital postseptal cellulitis from extension of right frontal and right ethmoid sinus disease. No orbital abscess identified. These results were called by telephone at the time of interpretation on 02/13/2016 at 9:17 am to Apolonio Schneiders, who verbally acknowledged these results. Electronically Signed   By: Sebastian Ache M.D.   On: 02/13/2016 09:20   I have personally reviewed and evaluated these images and lab results as part of my medical decision-making.  Patient was started on vancomycin and Unasyn.  I spoke with the [facility] hospitalist ophthalmology and ENT.  Patient is given the plan and all questions were answered.  I did advise him the serious nature of this condition and that hospitalization would be required  Charlestine Night, PA-C 02/14/16 1610  Margarita Grizzle, MD 02/15/16  [phone]

## 2016-02-14 ENCOUNTER — Observation Stay (HOSPITAL_COMMUNITY): Payer: BLUE CROSS/BLUE SHIELD | Admitting: Anesthesiology

## 2016-02-14 ENCOUNTER — Encounter (HOSPITAL_COMMUNITY): Payer: Self-pay | Admitting: Anesthesiology

## 2016-02-14 ENCOUNTER — Encounter (HOSPITAL_COMMUNITY)
Admission: EM | Disposition: A | Discharge status: Home / Self Care | Payer: Self-pay | Source: Home / Self Care | Attending: Internal Medicine

## 2016-02-14 ENCOUNTER — Observation Stay (HOSPITAL_COMMUNITY): Payer: BLUE CROSS/BLUE SHIELD

## 2016-02-14 DIAGNOSIS — R509 Fever, unspecified: Secondary | ICD-10-CM | POA: Diagnosis not present

## 2016-02-14 DIAGNOSIS — A419 Sepsis, unspecified organism: Secondary | ICD-10-CM | POA: Diagnosis not present

## 2016-02-14 DIAGNOSIS — N179 Acute kidney failure, unspecified: Secondary | ICD-10-CM | POA: Diagnosis not present

## 2016-02-14 DIAGNOSIS — I10 Essential (primary) hypertension: Secondary | ICD-10-CM

## 2016-02-14 DIAGNOSIS — L03213 Periorbital cellulitis: Secondary | ICD-10-CM | POA: Diagnosis not present

## 2016-02-14 DIAGNOSIS — N182 Chronic kidney disease, stage 2 (mild): Secondary | ICD-10-CM | POA: Diagnosis not present

## 2016-02-14 HISTORY — PX: NASAL SINUS SURGERY: SHX719

## 2016-02-14 LAB — BASIC METABOLIC PANEL
Anion gap: 9 (ref 5–15)
BUN: 11 mg/dL (ref 6–20)
CALCIUM: 8.8 mg/dL — AB (ref 8.9–10.3)
CO2: 25 mmol/L (ref 22–32)
CREATININE: 1.41 mg/dL — AB (ref 0.61–1.24)
Chloride: 101 mmol/L (ref 101–111)
GFR calc Af Amer: >60 mL/min (ref >60)
GFR calc non Af Amer: 59 mL/min — ABNORMAL LOW (ref >60)
GLUCOSE: 126 mg/dL — AB (ref 65–99)
Potassium: 3.5 mmol/L (ref 3.5–5.1)
Sodium: 135 mmol/L (ref 135–145)

## 2016-02-14 LAB — CBC
HCT: 40.5 % (ref 39.0–52.0)
HEMOGLOBIN: 12.9 g/dL — AB (ref 13.0–17.0)
MCH: 27.7 pg (ref 26.0–34.0)
MCHC: 31.9 g/dL (ref 30.0–36.0)
MCV: 87.1 fL (ref 78.0–100.0)
PLATELETS: 192 10*3/uL (ref 150–400)
RBC: 4.65 MIL/uL (ref 4.22–5.81)
RDW: 14.8 % (ref 11.5–15.5)
WBC: 11.3 10*3/uL — ABNORMAL HIGH (ref 4.0–10.5)

## 2016-02-14 SURGERY — SINUS SURGERY, ENDOSCOPIC
Anesthesia: General | Laterality: Right

## 2016-02-14 MED ORDER — OXYMETAZOLINE HCL 0.05 % NA SOLN
NASAL | Status: DC | PRN
  Administered 2016-02-14: 1 via TOPICAL

## 2016-02-14 MED ORDER — FENTANYL CITRATE (PF) 250 MCG/5ML IJ SOLN
INTRAMUSCULAR | Status: DC | PRN
  Administered 2016-02-14 (×4): 50 ug via INTRAVENOUS

## 2016-02-14 MED ORDER — LIDOCAINE-EPINEPHRINE 1 %-1:100000 IJ SOLN
INTRAMUSCULAR | Status: DC | PRN
  Administered 2016-02-14: 2 mL

## 2016-02-14 MED ORDER — FLUTICASONE PROPIONATE 50 MCG/ACT NA SUSP
1.0000 | Freq: Two times a day (BID) | NASAL | Status: DC
  Administered 2016-02-14 – 2016-02-16 (×4): 1 via NASAL
  Filled 2016-02-14: qty 16

## 2016-02-14 MED ORDER — MIDAZOLAM HCL 5 MG/5ML IJ SOLN
INTRAMUSCULAR | Status: DC | PRN
  Administered 2016-02-14: 2 mg via INTRAVENOUS

## 2016-02-14 MED ORDER — LACTATED RINGERS IV SOLN
INTRAVENOUS | Status: DC | PRN
  Administered 2016-02-14: 19:00:00 via INTRAVENOUS

## 2016-02-14 MED ORDER — PROPOFOL 10 MG/ML IV BOLUS
INTRAVENOUS | Status: DC | PRN
  Administered 2016-02-14: 200 mg via INTRAVENOUS

## 2016-02-14 MED ORDER — OXYMETAZOLINE HCL 0.05 % NA SOLN
NASAL | Status: AC
  Filled 2016-02-14: qty 15

## 2016-02-14 MED ORDER — 0.9 % SODIUM CHLORIDE (POUR BTL) OPTIME
TOPICAL | Status: DC | PRN
  Administered 2016-02-14: 1000 mL

## 2016-02-14 MED ORDER — LIDOCAINE-EPINEPHRINE 1 %-1:100000 IJ SOLN
INTRAMUSCULAR | Status: AC
  Filled 2016-02-14: qty 1

## 2016-02-14 MED ORDER — ENOXAPARIN SODIUM 80 MG/0.8ML ~~LOC~~ SOLN
70.0000 mg | SUBCUTANEOUS | Status: DC
  Administered 2016-02-15: 70 mg via SUBCUTANEOUS
  Filled 2016-02-14: qty 0.8

## 2016-02-14 MED ORDER — SODIUM CHLORIDE 0.9 % IV SOLN
INTRAVENOUS | Status: DC
Start: 2016-02-14 — End: 2016-02-16
  Administered 2016-02-14 – 2016-02-16 (×4): via INTRAVENOUS

## 2016-02-14 MED ORDER — OXYCODONE HCL 5 MG/5ML PO SOLN: 5.0000 mg | Freq: Once | ORAL | Status: DC | PRN

## 2016-02-14 MED ORDER — SUCCINYLCHOLINE CHLORIDE 20 MG/ML IJ SOLN
INTRAMUSCULAR | Status: DC | PRN
  Administered 2016-02-14: 120 mg via INTRAVENOUS

## 2016-02-14 MED ORDER — MORPHINE SULFATE (PF) 2 MG/ML IV SOLN
2.0000 mg | INTRAVENOUS | Status: DC | PRN
  Administered 2016-02-16: 2 mg via INTRAVENOUS
  Filled 2016-02-14: qty 1

## 2016-02-14 MED ORDER — HYDROMORPHONE HCL 1 MG/ML IJ SOLN: 0.2500 mg | INTRAMUSCULAR | Status: DC | PRN

## 2016-02-14 MED ORDER — IOPAMIDOL (ISOVUE-300) INJECTION 61%
INTRAVENOUS | Status: AC
  Administered 2016-02-14: 75 mL
  Filled 2016-02-14: qty 75

## 2016-02-14 MED ORDER — MIDAZOLAM HCL 2 MG/2ML IJ SOLN
INTRAMUSCULAR | Status: AC
  Filled 2016-02-14: qty 2

## 2016-02-14 MED ORDER — FENTANYL CITRATE (PF) 250 MCG/5ML IJ SOLN
INTRAMUSCULAR | Status: AC
  Filled 2016-02-14: qty 5

## 2016-02-14 MED ORDER — EPINEPHRINE HCL (NASAL) 0.1 % NA SOLN
NASAL | Status: AC
  Filled 2016-02-14: qty 30

## 2016-02-14 MED ORDER — ONDANSETRON HCL 4 MG/2ML IJ SOLN: 4.0000 mg | Freq: Four times a day (QID) | INTRAMUSCULAR | Status: DC | PRN

## 2016-02-14 MED ORDER — ONDANSETRON HCL 4 MG/2ML IJ SOLN
INTRAMUSCULAR | Status: DC | PRN
  Administered 2016-02-14: 4 mg via INTRAVENOUS

## 2016-02-14 MED ORDER — LIDOCAINE HCL (CARDIAC) 20 MG/ML IV SOLN
INTRAVENOUS | Status: DC | PRN
  Administered 2016-02-14: 60 mg via INTRATRACHEAL

## 2016-02-14 MED ORDER — OXYCODONE HCL 5 MG PO TABS: 5.0000 mg | ORAL_TABLET | Freq: Once | ORAL | Status: DC | PRN

## 2016-02-14 MED ORDER — BACITRACIN-NEOMYCIN-POLYMYXIN 400-5-5000 EX OINT
TOPICAL_OINTMENT | CUTANEOUS | Status: AC
  Filled 2016-02-14: qty 1

## 2016-02-14 SURGICAL SUPPLY — 28 items
BLADE TRICUT ROTATE M4 4 5PK (BLADE) ×2 IMPLANT
CANISTER SUCTION 2500CC (MISCELLANEOUS) ×3 IMPLANT
COAGULATOR SUCT SWTCH 10FR 6 (ELECTROSURGICAL) IMPLANT
DRAPE PROXIMA HALF (DRAPES) IMPLANT
ELECT REM PT RETURN 9FT ADLT (ELECTROSURGICAL)
ELECTRODE REM PT RTRN 9FT ADLT (ELECTROSURGICAL) IMPLANT
FLOSEAL 10ML (HEMOSTASIS) IMPLANT
GAUZE SPONGE 2X2 8PLY STRL LF (GAUZE/BANDAGES/DRESSINGS) ×1 IMPLANT
GLOVE ECLIPSE 7.5 STRL STRAW (GLOVE) ×3 IMPLANT
GOWN STRL REUS W/ TWL LRG LVL3 (GOWN DISPOSABLE) ×4 IMPLANT
GOWN STRL REUS W/TWL LRG LVL3 (GOWN DISPOSABLE) ×6
KIT BASIN OR (CUSTOM PROCEDURE TRAY) ×3 IMPLANT
KIT ROOM TURNOVER OR (KITS) ×3 IMPLANT
NDL HYPO 25GX1X1/2 BEV (NEEDLE) IMPLANT
NDL SPNL 25GX3.5 QUINCKE BL (NEEDLE) ×1 IMPLANT
NEEDLE HYPO 25GX1X1/2 BEV (NEEDLE) ×3 IMPLANT
NEEDLE SPNL 25GX3.5 QUINCKE BL (NEEDLE) ×3 IMPLANT
NS IRRIG 1000ML POUR BTL (IV SOLUTION) ×3 IMPLANT
PAD ARMBOARD 7.5X6 YLW CONV (MISCELLANEOUS) ×6 IMPLANT
SPLINT NASAL DOYLE BI-VL (GAUZE/BANDAGES/DRESSINGS) ×1 IMPLANT
SPONGE GAUZE 2X2 STER 10/PKG (GAUZE/BANDAGES/DRESSINGS)
SPONGE NEURO XRAY DETECT 1X3 (DISPOSABLE) ×3 IMPLANT
SUT PLAIN 4 0 ~~LOC~~ 1 (SUTURE) ×3 IMPLANT
TOWEL OR 17X24 6PK STRL BLUE (TOWEL DISPOSABLE) ×3 IMPLANT
TRAY ENT MC OR (CUSTOM PROCEDURE TRAY) ×3 IMPLANT
TUBE SALEM SUMP 16 FR W/ARV (TUBING) ×3 IMPLANT
TUBING EXTENTION W/L.L. (IV SETS) ×3 IMPLANT
WATER STERILE IRR 1000ML POUR (IV SOLUTION) ×3 IMPLANT

## 2016-02-14 NOTE — Progress Notes (Signed)
Pt's right eye started having bloody drainage. Notified hospitalist but the on call MD suggested calling the ophthalmologist. When the ophthalmologist (Dr. Para Skeansichard S. Groat) was notified, he said that he doesn't have concern for the drainage than anything else that is going on with him. He said he will be here first thing in AM to check him out.

## 2016-02-14 NOTE — Progress Notes (Signed)
Subjective: Persistent right eye pain.   Objective: Vital signs in last 24 hours: Temp:  [98.9 F (37.2 C)-100.6 F (38.1 C)] 98.9 F (37.2 C) (06/04 0647) Pulse Rate:  [71-93] 71 (06/04 0647) Resp:  [16-18] 18 (06/04 0647) BP: (140-151)/(66-84) 147/71 mmHg (06/04 0647) SpO2:  [95 %-100 %] 98 % (06/04 0647) Weight:  [136.079 kg (300 lb)] 136.079 kg (300 lb) (06/03 1303)  Physical Exam: General: Awake, alert, and oriented. Eyes: Marked edema of right upper lid. Left pupil, conjunctiva and lid normal. Blurry vision on the right. Decreased right EOM. Ears: Normal auricles and EACs. Nose: Moist and congested mucosa bilaterally. Mouth: Mucous membranes are moist. Posterior pharynx clear of any exudate or lesions.Normal dentition.  Neck: normal, supple, no masses Respiratory: clear to auscultation bilaterally, no wheezing, no crackles. Normal respiratory effort. No accessory muscle use.  Cardiovascular: Regular rate and rhythm.   Musculoskeletal: no clubbing / cyanosis. No joint deformity upper and lower extremities. Good ROM, no contractures. Normal muscle tone.  Skin: no rashes, lesions, ulcers.  Neurologic: CN 2-12 grossly intact. Sensation intact, Strength 5/5 in all 4.  Psychiatric: Normal judgment and insight. Alert and oriented x 3. Normal mood.    Recent Labs  02/13/16 0940 02/14/16 0342  WBC 10.1 11.3*  HGB 14.1 12.9*  HCT 44.1 40.5  PLT 185 192    Recent Labs  02/13/16 0844 02/14/16 0342  NA 141 135  K 4.4 3.5  CL 102 101  CO2  --  25  GLUCOSE 101* 126*  BUN 16 11  CREATININE 1.50* 1.41*  CALCIUM  --  8.8*    Medications:  I have reviewed the patient's current medications. Scheduled: . ampicillin-sulbactam (UNASYN) IV  1.5 g Intravenous Q6H  . dorzolamide-timolol  1 drop Right Eye BID  . enoxaparin (LOVENOX) injection  40 mg Subcutaneous Q24H  . vancomycin  1,000 mg Intravenous Q12H   ZOX:[id number]:acetaminophen **OR** acetaminophen, bisacodyl,  HYDROcodone-acetaminophen, ondansetron **OR** ondansetron (ZOFRAN) IV, polyethylene glycol  Assessment/Plan: Right orbital cellulitis, secondary to acute right frontal and ethmoid sinusitis. Purulent drainage noted from the right middle meatus on nasal endoscopy. Now with restricted right extraocular motion. Will repeat sinus CT. Likely will need surgical drainage of the right ethmoid and frontal sinuses with decompression of the medial orbital wall.    Leani Myron,SUI W 02/14/2016, 8:22 AM

## 2016-02-14 NOTE — Consult Note (Signed)
Ophthalmology Progress Note  Hector Lee,[name], 45 y.o. male Date of Service:  02/14/2016 [phone](0845AM)  HPI/Interval history: [name] is a 45 year old man with severe sinus disease who presented with post-septal cellulitis on the right 2/2 extension from frontal/ethmoidal sinusitis. He was started on IV vanc/Unasyn with minimal improvement today. He believes swelling is about the same, but VA OD is blurrier than yesterday.  Inpatient Meds: . ampicillin-sulbactam (UNASYN) IV  1.5 g Intravenous Q6H  . dorzolamide-timolol  1 drop Right Eye BID  . enoxaparin (LOVENOX) injection  40 mg Subcutaneous Q24H  . fluticasone  1 spray Each Nare BID  . vancomycin  1,000 mg Intravenous Q12H   Exam: Temp: 98.9 F (37.2 C) Pulse Rate: 71 BP: (!) 147/71 mmHg Resp: 18 SpO2: 98 %  Visual Acuity:  near   OD HM   OS 20/25     OD OS  EOM (Primary) -3.5 in all gazes (slightly worse compared to prior) Full  Lids/Lashes Stable upper and lower lid edema Normal  Conjunctiva 2-3+ chemosis with subconjunctival hemorrhage (slightly worse) White, quiet  Pupils  4 --> 3, sluggish, +rAPD 4 --> 2, somewhat brisk, no rAPD  Cornea  Clear Clear  Anterior Chamber Formed Formed  Lens:  Clear Clear  IOP 27 Soft/normal to digital palpation  Fundus - Dilated? NO - see initial consult note for dilated exam     A/P:  45 y.o. male with history of sinus disease presents with 1-day history of rapidly progressive swelling right eyelids --> right orbital cellulitis.  1) Right orbital cellulitis - no obvious improvement - Agree with repeat imaging and surgical intervention with Dr. Suszanne Connerseoh (ENT) given minimal improvement - appreciate his input. - Continue vanc/Unasyn.  2) Ocular hypertension OD - Continue Cosopt BID OD.  Proceed with surgical decompression with Dr. Suszanne Connerseoh. I will reevaluate the patient later this evening.  [name], MD 02/14/2016, 9:20 AM

## 2016-02-14 NOTE — Anesthesia Preprocedure Evaluation (Addendum)
Anesthesia Evaluation  Patient identified by MRN, date of birth, ID band Patient awake    Reviewed: Allergy & Precautions, NPO status , Patient's Chart, lab work & pertinent test results  Airway Mallampati: II  TM Distance: >3 FB Neck ROM: full    Dental  (+) Teeth Intact, Dental Advisory Given   Pulmonary neg pulmonary ROS,    breath sounds clear to auscultation       Cardiovascular hypertension,  Rhythm:regular Rate:Normal     Neuro/Psych    GI/Hepatic   Endo/Other  obese  Renal/GU      Musculoskeletal   Abdominal   Peds  Hematology   Anesthesia Other Findings   Reproductive/Obstetrics                            Anesthesia Physical Anesthesia Plan  ASA: II  Anesthesia Plan: General   Post-op Pain Management:    Induction: Intravenous  Airway Management Planned: Oral ETT  Additional Equipment:   Intra-op Plan:   Post-operative Plan: Extubation in OR  Informed Consent: I have reviewed the patients History and Physical, chart, labs and discussed the procedure including the risks, benefits and alternatives for the proposed anesthesia with the patient or authorized representative who has indicated his/her understanding and acceptance.   Dental advisory given  Plan Discussed with: CRNA, Anesthesiologist and Surgeon  Anesthesia Plan Comments:        Anesthesia Quick Evaluation

## 2016-02-14 NOTE — Op Note (Signed)
DATE OF PROCEDURE: 02/14/2016  OPERATIVE REPORT   SURGEON: Newman PiesSu Nicolle Heward, MD   PREOPERATIVE DIAGNOSES:  1. Right orbital cellulitis and abscess 2. Right acute frontal and ethmoid sinusitis 3. Right frontal mucocele  POSTOPERATIVE DIAGNOSES:  1. Right orbital cellulitis and abscess 2. Right acute frontal and ethmoid sinusitis 3. Right frontal mucocele  PROCEDURE PERFORMED:  1. Right endoscopic ethmoidectomy, frontal sinusotomy, and decompression of right medial orbital wall.  ANESTHESIA: General endotracheal tube anesthesia.   COMPLICATIONS: None.   ESTIMATED BLOOD LOSS: Less than 10 mL.   INDICATION FOR PROCEDURE: Hector Lee is a 45 y.o. male who presented to [facility] emergency room yesterday complaining of right eye swelling and pain. On his CT scan, he was noted to have a right orbital cellulitis, secondary to acute right frontal and ethmoid sinusitis. He was admitted for IV antibiotic treatment. However his  orbital cellulitis continued to worsen. His subsequent CT scan showed a developing abscess within the right orbit. His CT also showed a right frontal mucocele, obstructing the right frontal recess. Based on the above findings, the decision was made for the patient to undergo the above-stated procedure. The risks, benefits, alternatives, and details of the procedure were discussed with the patient. Questions were invited and answered. Informed consent was obtained.   DESCRIPTION OF PROCEDURE: The patient was taken to the operating room and placed supine on the operating table. General endotracheal tube anesthesia was administered by the anesthesiologist. The patient was positioned, and prepped and draped in the standard fashion for nasal surgery. Pledgets soaked with Afrin were placed in the nasal cavities for decongestion. The pledgets were subsequently removed.   Under the guidance of the 0 endoscope, the right middle turbinate was carefully medialized. The uncinate  process was resected with a freer elevator. The anterior ethmoid cavity was entered with a microdebrider. At this time, a calcified mucocele was noted to obstruct the anterior ethmoid air cells and the frontal recess. The lateral wall of the calcified mucocele and the medial orbital wall were carefully removed. Purulent drainage was noted from the right orbital cavity. The frontal recess was then carefully enlarged and irrigated.  The care of the patient was turned over to the anesthesiologist. The patient was awakened from anesthesia without difficulty. The patient was extubated and transferred to the recovery room in good condition.   OPERATIVE FINDINGS: Right orbital abscess. Right acute frontal and ethmoid sinusitis. Right frontal mucocele.  SPECIMEN: Aerobic and anaerobic cultures of the purulent drainage.  FOLLOWUP CARE: The patient be re-admitted to the surgical floor. He will continue his IV abx.  Hector Mccarrell Philomena DohenyWooi Jesiah Yerby, MD

## 2016-02-14 NOTE — Progress Notes (Signed)
Hospitalist on call called back to check on the patient and to find out what the ophthalmologist suggested. I updated him on that.

## 2016-02-14 NOTE — Transfer of Care (Signed)
Immediate Anesthesia Transfer of Care Note  Patient: Hector Lee  Procedure(s) Performed: Procedure(s): ENDOSCOPIC RIGHT MEDIAL ORBITAL WALL DECOMPRESSION (Right)  Patient Location: PACU  Anesthesia Type:General  Level of Consciousness: sedated  Airway & Oxygen Therapy: Patient Spontanous Breathing and Patient connected to face mask oxygen  Post-op Assessment: Report given to RN and Post -op Vital signs reviewed and stable  Post vital signs: Reviewed and stable  Last Vitals:  Filed Vitals:   02/14/16 0647 02/14/16 1333  BP: 147/71 153/77  Pulse: 71 63  Temp: 37.2 C 37.7 C  Resp: 18     Last Pain:  Filed Vitals:   02/14/16 1407  PainSc: 2       Patients Stated Pain Goal: 2 (02/14/16 [id number])  Complications: No apparent anesthesia complications

## 2016-02-14 NOTE — Progress Notes (Signed)
PROGRESS NOTE    BERTRAM HADDIX  ZOX:[id number] DOB: [birth year] DOA: 02/13/2016 PCP: Oliver Barre, MD   Brief Narrative:  HPI on 02/13/2016 by Ms. Willette Cluster, NP CHRISTOPHERJOHN SCHIELE is a 45 y.o. male with no significant past medical history . A few days ago patient developed some nasal congestion, scratchy voice. Yesterday wife noticed right eye was slightly red which is not unusual for patient. He does give a history of environmental/seasonal allergies. Around 3 AM patient woke up with discomfort in right eye. The right eye was markedly swollen. Denies recent trauma to the eye. He does have a slight frontal headache today.  Assessment & Plan   Sepsis secondary to Right orbital cellulitis/Right frontal and ethmoid sinusitis -Upon admission, patient mildly tachycardic with a rate of 93, mild temperature of 100.49F. Today patient has leukocytosis. -CT orbits on 02/13/2016: Right orbital post-septal cellulitis from extension of right frontal and right ethmoid sinus disease, no orbital abscess identified -Ophthalmology and ENT consulted and appreciated -Repeat CT ORBIT 02/14/2016: Progressive post-septal orbital cellulitis associated with acute right frontal and ethmoid sinusitis, increasing extraconal post-septal abscess with mass effect on the right orbit, abscess 6 x 16 x 18 mm -Continue IV vancomycin and Unasyn -Plan for surgical drainage today by Dr. Suszanne Conners (ENT) -Added Flonase -Continue pain control  ? Chronic kidney disease, stage II -Creatinine currently 1.4, was 1.4 in 2014 -Given the patient has had IV contrast twice, will start normal saline and continue to monitor BMP closely  DVT Prophylaxis  Lovenox  Code Status: Full  Family Communication: Fianc at bedside  Disposition Plan: Admitted, pending surgery today  Consultants ENT Ophthalmology  Procedures  None  Antibiotics   Anti-infectives    Start     Dose/Rate Route Frequency Ordered Stop   02/14/16 0000   vancomycin (VANCOCIN) IVPB 1000 mg/200 mL premix     1,000 mg 200 mL/hr over 60 Minutes Intravenous Every 12 hours 02/13/16 1025     02/13/16 1630  ampicillin-sulbactam (UNASYN) 1.5 g in sodium chloride 0.9 % 50 mL IVPB     1.5 g 100 mL/hr over 30 Minutes Intravenous Every 6 hours 02/13/16 1209     02/13/16 1100  vancomycin (VANCOCIN) 2,500 mg in sodium chloride 0.9 % 500 mL IVPB     2,500 mg 250 mL/hr over 120 Minutes Intravenous  Once 02/13/16 1023 02/13/16 1332   02/13/16 0945  Ampicillin-Sulbactam (UNASYN) 3 g in sodium chloride 0.9 % 100 mL IVPB     3 g 100 mL/hr over 60 Minutes Intravenous  Once 02/13/16 0931 02/13/16 1123      Subjective:   [name] seen and examined today.  Patient continues to complain of right blurry vision, and eye pain, and inability to raise his eyelid. Has had drainage from the right eye as well. Denies any chest pain, shortness of breath, abdominal pain, nausea or vomiting, headache.  Does complain of feeling warm.  Objective:   Filed Vitals:   02/13/16 1514 02/13/16 2120 02/13/16 2300 02/14/16 0647  BP:  140/77  147/71  Pulse:  80  71  Temp: 99.9 F (37.7 C) 100.4 F (38 C) 99.5 F (37.5 C) 98.9 F (37.2 C)  TempSrc: Oral Oral Oral   Resp:  18  18  Height:      Weight:      SpO2:  99%  98%    Intake/Output Summary (Last 24 hours) at 02/14/16 1146 Last data filed at 02/14/16 1021  Gross per 24  hour  Intake    300 ml  Output   2175 ml  Net  -1875 ml   Filed Weights   02/13/16 1017 02/13/16 1303  Weight: 136.079 kg (300 lb) 136.079 kg (300 lb)    Exam  General: Well developed, well nourished, NAD, appears stated age  HEENT: NCAT, Marked edema of the right orbit with thin drainage, mucous membranes moist.   Neck: Supple, no JVD, no masses  Cardiovascular: S1 S2 auscultated, no rubs, murmurs or gallops. Regular rate and rhythm.  Respiratory: Clear to auscultation bilaterally   Abdomen: Soft, nontender, nondistended, +  bowel sounds  Extremities: warm dry without cyanosis clubbing or edema  Neuro: AAOx3, per patient, vision has been blurry, otherwise nonfocal  Skin: Without rashes exudates or nodules  Psych: Normal affect and demeanor with intact judgement and insight   Data Reviewed: I have personally reviewed following labs and imaging studies  CBC:  Recent Labs Lab 02/13/16 0844 02/13/16 0940 02/14/16 0342  WBC  --  10.1 11.3*  NEUTROABS  --  7.7  --   HGB 15.6 14.1 12.9*  HCT 46.0 44.1 40.5  MCV  --  88.2 87.1  PLT  --  185 192   Basic Metabolic Panel:  Recent Labs Lab 02/13/16 0844 02/14/16 0342  NA 141 135  K 4.4 3.5  CL 102 101  CO2  --  25  GLUCOSE 101* 126*  BUN 16 11  CREATININE 1.50* 1.41*  CALCIUM  --  8.8*   GFR: Estimated Creatinine Clearance: 99.4 mL/min (by C-G formula based on Cr of 1.41). Liver Function Tests: No results for input(s): AST, ALT, ALKPHOS, BILITOT, PROT, ALBUMIN in the last 168 hours. No results for input(s): LIPASE, AMYLASE in the last 168 hours. No results for input(s): AMMONIA in the last 168 hours. Coagulation Profile: No results for input(s): INR, PROTIME in the last 168 hours. Cardiac Enzymes: No results for input(s): CKTOTAL, CKMB, CKMBINDEX, TROPONINI in the last 168 hours. BNP (last 3 results) No results for input(s): PROBNP in the last 8760 hours. HbA1C: No results for input(s): HGBA1C in the last 72 hours. CBG: No results for input(s): GLUCAP in the last 168 hours. Lipid Profile: No results for input(s): CHOL, HDL, LDLCALC, TRIG, CHOLHDL, LDLDIRECT in the last 72 hours. Thyroid Function Tests: No results for input(s): TSH, T4TOTAL, FREET4, T3FREE, THYROIDAB in the last 72 hours. Anemia Panel: No results for input(s): VITAMINB12, FOLATE, FERRITIN, TIBC, IRON, RETICCTPCT in the last 72 hours. Urine analysis:    Component Value Date/Time   COLORURINE LT. YELLOW 04/09/2013 0812   APPEARANCEUR CLEAR 04/09/2013 0812   LABSPEC  1.015 04/09/2013 0812   PHURINE 6.0 04/09/2013 0812   GLUCOSEU NEGATIVE 04/09/2013 0812   HGBUR NEGATIVE 04/09/2013 0812   BILIRUBINUR NEGATIVE 04/09/2013 0812   KETONESUR NEGATIVE 04/09/2013 0812   UROBILINOGEN 0.2 04/09/2013 0812   NITRITE NEGATIVE 04/09/2013 0812   LEUKOCYTESUR NEGATIVE 04/09/2013 0812   Sepsis Labs: @LABRCNTIP (procalcitonin:4,lacticidven:4)  )No results found for this or any previous visit (from the past 240 hour(s)).    Radiology Studies: Ct Orbits W/cm  02/14/2016  CLINICAL DATA:  Right postseptal cellulitis readmitted yesterday. The patient has been a antibiotics. He states the swelling is unchanged but his right-sided vision his blurry ear. Postseptal cellulitis secondary to acute right frontal and ethmoid sinusitis with purulent drainage. Decreased right sided extraocular motion. Surgical drainage gland. EXAM: CT ORBITS WITH CONTRAST TECHNIQUE: Multidetector CT imaging of the orbits was performed following the bolus  administration of intravenous contrast. CONTRAST:  1 ISOVUE-300 IOPAMIDOL (ISOVUE-300) INJECTION 61% COMPARISON:  CT orbits with contrast yesterday. FINDINGS: Postseptal right orbital cellulitis as extension from the acute right frontal and maxillary sinusitis is again noted. The extra conal abscess evident on yesterday's scan has increased in size, now measuring 6 x 16 x 18 mm. This compares with 4 x 14 x 13 mm yesterday. There is increased mass effect on the right orbit. Extra conal soft tissue inflammatory changes are noted superiorly as well extending to the lateral wall of the orbit. This is slightly more prominent than on yesterday's exam . Intraorbital mass effect is increased with progressive exophthalmos. There is no inflammatory change associated with the superior rectus muscle which was less evident on yesterday's exam. Perforation of the medial orbital wall by the right ethmoid and frontal sinus is again noted. There is diffuse opacification of the  right frontal and anterior ethmoid air cells. The area appears be encapsulated by a calcified rim the suggesting a frontal mucocele which has expanded into the anterior ethmoids. This then results in secondary obstruction of the residual anterior ethmoid and frontal sinuses. Mild diffuse mucosal thickening is again seen within the maxillary sinuses bilaterally. Prominent polyps or mucous retention cysts are noted anteriorly and inferiorly in the left frontal sinuses maxillary sinus. There is mild mucosal thickening throughout the left ethmoid air cells and inferior left frontal sinus. Mild mucosal thickening is also noted within the anterior left sphenoid sinus. Subtle low density along the anterior inferior right frontal lobe may be artifact. Early encephalitis cannot be excluded on the basis of CT. MRI brain with contrast is recommended for further evaluation. IMPRESSION: 1. Progressive postseptal orbital cellulitis associated with acute right frontal and ethmoid sinusitis. 2. Increasing extraconal postseptal abscess with mass effect on the right orbit. The abscess now measures 6 x 16 x 18 mm. 3. Increased diffuse extraconal inflammatory changes over the superior aspect of the right orbit. 4. Increasing exophthalmos. 5. Progressive inflammatory changes associated with the right superior rectus muscle suggesting early intraconal inflammatory changes well. 6. 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 7. Subtle low density along the anterior inferior aspect the right frontal lobe raises the possibility of intracranial inflammatory change is well. Recommend MRI of the brain for further evaluation as this may be artifactual on CT. These results were called by telephone at the time of interpretation on 02/14/2016 at 10:18 am to Dr. Newman Pies , who verbally acknowledged these results.  Electronically Signed   By: Marin Roberts M.D.   On: 02/14/2016 10:24   Ct Orbits W/cm  02/13/2016  CLINICAL DATA:  Right periorbital swelling. Nasal and sinus congestion. EXAM: CT ORBITS WITH CONTRAST TECHNIQUE: Multidetector CT imaging of the orbits was performed following the bolus administration of intravenous contrast. CONTRAST:  75 mL Isovue-300 COMPARISON:  None. FINDINGS: The right frontal sinus and anterior right ethmoid air cells are completely opacified. There is osseous dehiscence involving the inferolateral wall of the right frontal sinus and lamina papyracea. Soft tissue and stranding extends from the sinuses into the superior and medial extraconal space of the right orbit. No organized abscess is identified. There is mild right proptosis with mild stretching of the right optic nerve. There is also mild-to-moderate right preseptal periorbital soft tissue swelling. Moderate, partially polypoid left maxillary sinus mucosal thickening is partially visualized. There is also mild left  frontal, left ethmoid, and bilateral sphenoid sinus mucosal thickening. No sinus air-fluid levels are present. There is at most a trace left mastoid effusion. The visualized right mastoid air cells are clear. The visualized portion of the brain is unremarkable. No epidural collection is identified adjacent to the right-sided sinus disease. IMPRESSION: Right orbital postseptal cellulitis from extension of right frontal and right ethmoid sinus disease. No orbital abscess identified. These results were called by telephone at the time of interpretation on 02/13/2016 at 9:17 am to Apolonio Schneiders, who verbally acknowledged these results. Electronically Signed   By: Sebastian Ache M.D.   On: 02/13/2016 09:20     Scheduled Meds: . ampicillin-sulbactam (UNASYN) IV  1.5 g Intravenous Q6H  . dorzolamide-timolol  1 drop Right Eye BID  . enoxaparin (LOVENOX) injection  70 mg Subcutaneous Q24H  . fluticasone  1 spray Each Nare  BID  . vancomycin  1,000 mg Intravenous Q12H   Continuous Infusions: . sodium chloride 125 mL/hr at 02/14/16 1020       Time Spent in minutes   30 minutes  Aliveah Gallant D.O. on 02/14/2016 at 11:46 AM  Between 7am to 7pm - Pager - [phone]  After 7pm go to [email or web address] - password [email or web address]  And look for the night coverage person covering for me after hours  [facility] Office  [phone]

## 2016-02-14 NOTE — Anesthesia Procedure Notes (Signed)
Procedure Name: Intubation Date/Time: 02/14/2016 7:05 PM Performed by: Brien MatesMAHONY, Liam Bossman D Pre-anesthesia Checklist: Patient identified, Emergency Drugs available, Suction available, Patient being monitored and Timeout performed Patient Re-evaluated:Patient Re-evaluated prior to inductionOxygen Delivery Method: Circle system utilized Preoxygenation: Pre-oxygenation with 100% oxygen Intubation Type: IV induction Ventilation: Mask ventilation without difficulty Laryngoscope Size: Miller and 2 Grade View: Grade II Tube type: Oral Tube size: 7.5 mm Number of attempts: 1 Airway Equipment and Method: Stylet Placement Confirmation: positive ETCO2,  ETT inserted through vocal cords under direct vision and breath sounds checked- equal and bilateral Secured at: 23 cm Tube secured with: Tape Dental Injury: Teeth and Oropharynx as per pre-operative assessment

## 2016-02-15 ENCOUNTER — Encounter (HOSPITAL_COMMUNITY): Payer: Self-pay | Admitting: Otolaryngology

## 2016-02-15 DIAGNOSIS — A419 Sepsis, unspecified organism: Principal | ICD-10-CM

## 2016-02-15 DIAGNOSIS — N179 Acute kidney failure, unspecified: Secondary | ICD-10-CM

## 2016-02-15 DIAGNOSIS — H05011 Cellulitis of right orbit: Secondary | ICD-10-CM | POA: Diagnosis present

## 2016-02-15 DIAGNOSIS — L03213 Periorbital cellulitis: Secondary | ICD-10-CM | POA: Diagnosis not present

## 2016-02-15 DIAGNOSIS — H578 Other specified disorders of eye and adnexa: Secondary | ICD-10-CM | POA: Diagnosis present

## 2016-02-15 DIAGNOSIS — N182 Chronic kidney disease, stage 2 (mild): Secondary | ICD-10-CM | POA: Diagnosis present

## 2016-02-15 DIAGNOSIS — E1122 Type 2 diabetes mellitus with diabetic chronic kidney disease: Secondary | ICD-10-CM | POA: Diagnosis present

## 2016-02-15 DIAGNOSIS — I10 Essential (primary) hypertension: Secondary | ICD-10-CM | POA: Diagnosis not present

## 2016-02-15 DIAGNOSIS — J0110 Acute frontal sinusitis, unspecified: Secondary | ICD-10-CM | POA: Diagnosis present

## 2016-02-15 DIAGNOSIS — R509 Fever, unspecified: Secondary | ICD-10-CM | POA: Diagnosis not present

## 2016-02-15 DIAGNOSIS — J0120 Acute ethmoidal sinusitis, unspecified: Secondary | ICD-10-CM | POA: Diagnosis present

## 2016-02-15 DIAGNOSIS — H40051 Ocular hypertension, right eye: Secondary | ICD-10-CM | POA: Diagnosis present

## 2016-02-15 LAB — CBC
HCT: 40.1 % (ref 39.0–52.0)
Hemoglobin: 13 g/dL (ref 13.0–17.0)
MCH: 28.3 pg (ref 26.0–34.0)
MCHC: 32.4 g/dL (ref 30.0–36.0)
MCV: 87.4 fL (ref 78.0–100.0)
PLATELETS: 191 10*3/uL (ref 150–400)
RBC: 4.59 MIL/uL (ref 4.22–5.81)
RDW: 14.7 % (ref 11.5–15.5)
WBC: 12.3 10*3/uL — AB (ref 4.0–10.5)

## 2016-02-15 LAB — BASIC METABOLIC PANEL
ANION GAP: 6 (ref 5–15)
BUN: 7 mg/dL (ref 6–20)
CO2: 28 mmol/L (ref 22–32)
Calcium: 8.7 mg/dL — ABNORMAL LOW (ref 8.9–10.3)
Chloride: 102 mmol/L (ref 101–111)
Creatinine, Ser: 1.36 mg/dL — ABNORMAL HIGH (ref 0.61–1.24)
Glucose, Bld: 128 mg/dL — ABNORMAL HIGH (ref 65–99)
POTASSIUM: 4 mmol/L (ref 3.5–5.1)
SODIUM: 136 mmol/L (ref 135–145)

## 2016-02-15 MED ORDER — ACETAZOLAMIDE ER 500 MG PO CP12
500.0000 mg | ORAL_CAPSULE | Freq: Every day | ORAL | Status: DC
  Administered 2016-02-15 – 2016-02-16 (×2): 500 mg via ORAL
  Filled 2016-02-15 (×2): qty 1

## 2016-02-15 NOTE — Progress Notes (Signed)
Subjective: Pt reports slight improvement in his right eye pain.  Objective: Vital signs in last 24 hours: Temp:  [98.6 F (37 C)-100.3 F (37.9 C)] 98.6 F (37 C) (06/05 0547) Pulse Rate:  [62-80] 62 (06/05 0547) Resp:  [18-24] 18 (06/05 0547) BP: (133-156)/(66-87) 133/66 mmHg (06/05 0547) SpO2:  [96 %-100 %] 100 % (06/05 0547)  Physical Exam: General: Awake, alert, and oriented. Eyes: Marked edema of right upper lid. Left pupil, conjunctiva and lid normal. Blurry vision on the right. Right EOM slightly improved from yesterday. Ears: Normal auricles and EACs. Nose: Moist and congested mucosa bilaterally. No bleeding. Mouth: Mucous membranes are moist. Posterior pharynx clear of any exudate or lesions.Normal dentition.  Neck: normal, supple, no masses Respiratory: Normal respiratory effort. No accessory muscle use.  Cardiovascular: Regular rate and rhythm.   Skin: no rashes, lesions, ulcers.  Psychiatric: Normal judgment and insight. Alert and oriented x 3. Normal mood.    Recent Labs  02/14/16 0342 02/15/16 0536  WBC 11.3* 12.3*  HGB 12.9* 13.0  HCT 40.5 40.1  PLT 192 191    Recent Labs  02/14/16 0342 02/15/16 0536  NA 135 136  K 3.5 4.0  CL 101 102  CO2 25 28  GLUCOSE 126* 128*  BUN 11 7  CREATININE 1.41* 1.36*  CALCIUM 8.8* 8.7*    Medications:  I have reviewed the patient's current medications. Scheduled: . ampicillin-sulbactam (UNASYN) IV  1.5 g Intravenous Q6H  . dorzolamide-timolol  1 drop Right Eye BID  . enoxaparin (LOVENOX) injection  70 mg Subcutaneous Q24H  . fluticasone  1 spray Each Nare BID  . vancomycin  1,000 mg Intravenous Q12H   ZOX:[id number]:acetaminophen **OR** acetaminophen, bisacodyl, HYDROcodone-acetaminophen, morphine injection, ondansetron **OR** ondansetron (ZOFRAN) IV, polyethylene glycol  Assessment/Plan: Right orbital cellulitis/abscess, secondary to acute right frontal and ethmoid sinusitis. POD #1 s/p surgical drainage of the  right ethmoid and frontal sinuses with decompression of the medial orbital wall. Continue IV abx. Will continue to follow closely.     Ashaz Robling,SUI W 02/15/2016, 7:30 AM

## 2016-02-15 NOTE — Progress Notes (Signed)
Ophthalmology Progress Note  Hector Lee,[name], 45 y.o. male Date of Service: 02/15/2016  HPI/Interval history: [name] is a 45 year old man with severe sinus disease who presented with post-septal cellulitis on the right 2/2 extension from frontal/ethmoidal sinusitis. He was started on IV vanc/Unasyn with minimal improvement. He subsequently underwent repeat CT, which showed a new abscess formation, and is now s/p OR decompression/drainage on 02/14/2016 (evening). He feels pain is much better, but swelling is about the same. VA is still very blurry even when the right eyelids are held open.   Inpatient Meds: . ampicillin-sulbactam (UNASYN) IV  1.5 g Intravenous Q6H  . dorzolamide-timolol  1 drop Right Eye BID  . enoxaparin (LOVENOX) injection  70 mg Subcutaneous Q24H  . fluticasone  1 spray Each Nare BID  . vancomycin  1,000 mg Intravenous Q12H     Exam: Filed Vitals:   02/15/16 0133 02/15/16 0547  BP: 154/81 133/66  Pulse: 67 62  Temp: 99.5 F (37.5 C) 98.6 F (37 C)  Resp: 20 18     Visual Acuity:  near   OD HM   OS 20/25     OD OS  EOM (Primary) -3 in all gazes (slightly better compared to prior) Full  Lids/Lashes Stable upper and lower lid edema Normal  Conjunctiva 2-3+ chemosis with subconjunctival hemorrhage (slightly worse) White, quiet  Pupils  4 --> 3, sluggish, +rAPD 4 --> 2, somewhat brisk, no rAPD  Cornea  Clear Clear  Anterior Chamber Formed Formed  Lens:  Clear Clear  IOP 41 Soft/normal to digital palpation  Fundus - Dilated NO - see initial consult note for dilated exam     A/P:  45 y.o. male with history of sinus disease presents with 2-day history of rapidly progressive swelling right eyelids --> right orbital cellulitis.  1) Right orbital cellulitis - s/p orbital decompression/drainage on 02/14/2016 (evening) - minimal improvement - EOM is marginally better today at best. - Pain is better. - IOP is  still very high. Recommend starting Diamox 500 mg PO qDay - ordered. - Continue vanc/Unasyn.  2) Ocular hypertension OD - Continue Cosopt BID OD.  If EOM and IOP do not further improve by tomorrow, must consider nidus of infection (abscess) that might require repeat trip to OR. [facility] possible further surgical intervention. Will continue to follow clinically and follow cultures.  R Fabian SharpScott Rosco Harriott, MD

## 2016-02-15 NOTE — Progress Notes (Signed)
PROGRESS NOTE    Hector Lee  NFA:[id number] DOB: 17-Mar-1971 DOA: 02/13/2016 PCP: Oliver Barre, MD   Brief Narrative:  HPI on 02/13/2016 by Ms. Willette Cluster, NP DOMANICK CUCCIA is a 45 y.o. male with no significant past medical history . A few days ago patient developed some nasal congestion, scratchy voice. Yesterday wife noticed right eye was slightly red which is not unusual for patient. He does give a history of environmental/seasonal allergies. Around 3 AM patient woke up with discomfort in right eye. The right eye was markedly swollen. Denies recent trauma to the eye. He does have a slight frontal headache today.  Assessment & Plan   Sepsis secondary to Right orbital cellulitis/Right frontal and ethmoid sinusitis -Upon admission, patient mildly tachycardic with a rate of 93, mild temperature of 100.50F. Today patient has leukocytosis. -CT orbits on 02/13/2016: Right orbital post-septal cellulitis from extension of right frontal and right ethmoid sinus disease, no orbital abscess identified -Ophthalmology and ENT consulted and appreciated -Repeat CT ORBIT 02/14/2016: Progressive post-septal orbital cellulitis associated with acute right frontal and ethmoid sinusitis, increasing extraconal post-septal abscess with mass effect on the right orbit, abscess 6 x 16 x 18 mm -Continue IV vancomycin and Unasyn -S/p surgical drainage of the right ethmoid and frontal sinuses with decompression of the medial orbital wall -Continue pain control and flonase  ? Chronic kidney disease, stage II -Creatinine currently 1.36, was 1.4 in 2014 -Continue to monitor BMP  DVT Prophylaxis  Lovenox  Code Status: Full  Family Communication: None at bedside  Disposition Plan: Admitted, Continue IV antibiotics  Consultants ENT Ophthalmology  Procedures  surgical drainage of the right ethmoid and frontal sinuses with decompression of the medial orbital wall  Antibiotics   Anti-infectives    Start     Dose/Rate Route Frequency Ordered Stop   02/14/16 0000  vancomycin (VANCOCIN) IVPB 1000 mg/200 mL premix     1,000 mg 200 mL/hr over 60 Minutes Intravenous Every 12 hours 02/13/16 1025     02/13/16 1630  ampicillin-sulbactam (UNASYN) 1.5 g in sodium chloride 0.9 % 50 mL IVPB     1.5 g 100 mL/hr over 30 Minutes Intravenous Every 6 hours 02/13/16 1209     02/13/16 1100  vancomycin (VANCOCIN) 2,500 mg in sodium chloride 0.9 % 500 mL IVPB     2,500 mg 250 mL/hr over 120 Minutes Intravenous  Once 02/13/16 1023 02/13/16 1332   02/13/16 0945  Ampicillin-Sulbactam (UNASYN) 3 g in sodium chloride 0.9 % 100 mL IVPB     3 g 100 mL/hr over 60 Minutes Intravenous  Once 02/13/16 0931 02/13/16 1123      Subjective:   Hector Lee seen and examined today.  Patient continues feels he is able to move his right eye more, but continues to have pain and swelling. Denies any chest pain, shortness of breath, abdominal pain, nausea or vomiting, headache.  Objective:   Filed Vitals:   02/14/16 2007 02/14/16 2026 02/15/16 0133 02/15/16 0547  BP: 150/87 156/82 154/81 133/66  Pulse: 79 75 67 62  Temp: 99.3 F (37.4 C) 100.3 F (37.9 C) 99.5 F (37.5 C) 98.6 F (37 C)  TempSrc:  Oral Oral Oral  Resp: 24  20 18   Height:      Weight:      SpO2: 99% 100% 97% 100%    Intake/Output Summary (Last 24 hours) at 02/15/16 1129 Last data filed at 02/15/16 0600  Gross per 24 hour  Intake 3708.33 ml  Output   3575 ml  Net 133.33 ml   Filed Weights   02/13/16 1017 02/13/16 1303  Weight: 136.079 kg (300 lb) 136.079 kg (300 lb)    Exam  General: Well developed, well nourished, NAD  HEENT: NCAT, Marked edema of the right orbit with thin drainage, mucous membranes moist.   Cardiovascular: S1 S2 auscultated, no rubs, murmurs or gallops. Regular rate and rhythm.  Respiratory: Clear to auscultation bilaterally   Abdomen: Soft, nontender, nondistended, + bowel sounds  Extremities: warm dry  without cyanosis clubbing or edema  Neuro: AAOx3, nonfocal, able to move right eye more  Psych: Normal affect and demeanor    Data Reviewed: I have personally reviewed following labs and imaging studies  CBC:  Recent Labs Lab 02/13/16 0844 02/13/16 0940 02/14/16 0342 02/15/16 0536  WBC  --  10.1 11.3* 12.3*  NEUTROABS  --  7.7  --   --   HGB 15.6 14.1 12.9* 13.0  HCT 46.0 44.1 40.5 40.1  MCV  --  88.2 87.1 87.4  PLT  --  185 192 191   Basic Metabolic Panel:  Recent Labs Lab 02/13/16 0844 02/14/16 0342 02/15/16 0536  NA 141 135 136  K 4.4 3.5 4.0  CL 102 101 102  CO2  --  25 28  GLUCOSE 101* 126* 128*  BUN CREATININE 1.50* 1.41* 1.36*  CALCIUM  --  8.8* 8.7*   GFR: Estimated Creatinine Clearance: 103 mL/min (by C-G formula based on Cr of 1.36). Liver Function Tests: No results for input(s): AST, ALT, ALKPHOS, BILITOT, PROT, ALBUMIN in the last 168 hours. No results for input(s): LIPASE, AMYLASE in the last 168 hours. No results for input(s): AMMONIA in the last 168 hours. Coagulation Profile: No results for input(s): INR, PROTIME in the last 168 hours. Cardiac Enzymes: No results for input(s): CKTOTAL, CKMB, CKMBINDEX, TROPONINI in the last 168 hours. BNP (last 3 results) No results for input(s): PROBNP in the last 8760 hours. HbA1C: No results for input(s): HGBA1C in the last 72 hours. CBG: No results for input(s): GLUCAP in the last 168 hours. Lipid Profile: No results for input(s): CHOL, HDL, LDLCALC, TRIG, CHOLHDL, LDLDIRECT in the last 72 hours. Thyroid Function Tests: No results for input(s): TSH, T4TOTAL, FREET4, T3FREE, THYROIDAB in the last 72 hours. Anemia Panel: No results for input(s): VITAMINB12, FOLATE, FERRITIN, TIBC, IRON, RETICCTPCT in the last 72 hours. Urine analysis:    Component Value Date/Time   COLORURINE LT. YELLOW 04/09/2013 0812   APPEARANCEUR CLEAR 04/09/2013 0812   LABSPEC 1.015 04/09/2013 0812   PHURINE 6.0  04/09/2013 0812   GLUCOSEU NEGATIVE 04/09/2013 0812   HGBUR NEGATIVE 04/09/2013 0812   BILIRUBINUR NEGATIVE 04/09/2013 0812   KETONESUR NEGATIVE 04/09/2013 0812   UROBILINOGEN 0.2 04/09/2013 0812   NITRITE NEGATIVE 04/09/2013 0812   LEUKOCYTESUR NEGATIVE 04/09/2013 0812   Sepsis Labs: (procalcitonin:4,lacticidven:4)  ) Recent Results (from the past 240 hour(s))  Aerobic/Anaerobic Culture(surg specimen) (NOT AT [facility])     Status: None (Preliminary result)   Collection Time: 02/14/16  8:15 PM  Result Value Ref Range Status   Specimen Description ABSCESS  Final   Special Requests NONE  Final   Gram Stain   Final    RARE WBC PRESENT, PREDOMINANTLY PMN NO ORGANISMS SEEN    Culture PENDING  Incomplete   Report Status PENDING  Incomplete      Radiology Studies: Ct Orbits W/cm  02/14/2016  CLINICAL DATA:  Right postseptal cellulitis  readmitted yesterday. The patient has been a antibiotics. He states the swelling is unchanged but his right-sided vision his blurry ear. Postseptal cellulitis secondary to acute right frontal and ethmoid sinusitis with purulent drainage. Decreased right sided extraocular motion. Surgical drainage gland. EXAM: CT ORBITS WITH CONTRAST TECHNIQUE: Multidetector CT imaging of the orbits was performed following the bolus administration of intravenous contrast. CONTRAST:  1 ISOVUE-300 IOPAMIDOL (ISOVUE-300) INJECTION 61% COMPARISON:  CT orbits with contrast yesterday. FINDINGS: Postseptal right orbital cellulitis as extension from the acute right frontal and maxillary sinusitis is again noted. The extra conal abscess evident on yesterday's scan has increased in size, now measuring 6 x 16 x 18 mm. This compares with 4 x 14 x 13 mm yesterday. There is increased mass effect on the right orbit. Extra conal soft tissue inflammatory changes are noted superiorly as well extending to the lateral wall of the orbit. This is slightly more prominent than on yesterday's exam .  Intraorbital mass effect is increased with progressive exophthalmos. There is no inflammatory change associated with the superior rectus muscle which was less evident on yesterday's exam. Perforation of the medial orbital wall by the right ethmoid and frontal sinus is again noted. There is diffuse opacification of the right frontal and anterior ethmoid air cells. The area appears be encapsulated by a calcified rim the suggesting a frontal mucocele which has expanded into the anterior ethmoids. This then results in secondary obstruction of the residual anterior ethmoid and frontal sinuses. Mild diffuse mucosal thickening is again seen within the maxillary sinuses bilaterally. Prominent polyps or mucous retention cysts are noted anteriorly and inferiorly in the left frontal sinuses maxillary sinus. There is mild mucosal thickening throughout the left ethmoid air cells and inferior left frontal sinus. Mild mucosal thickening is also noted within the anterior left sphenoid sinus. Subtle low density along the anterior inferior right frontal lobe may be artifact. Early encephalitis cannot be excluded on the basis of CT. MRI brain with contrast is recommended for further evaluation. IMPRESSION: 1. Progressive postseptal orbital cellulitis associated with acute right frontal and ethmoid sinusitis. 2. Increasing extraconal postseptal abscess with mass effect on the right orbit. The abscess now measures 6 x 16 x 18 mm. 3. Increased diffuse extraconal inflammatory changes over the superior aspect of the right orbit. 4. Increasing exophthalmos. 5. Progressive inflammatory changes associated with the right superior rectus muscle suggesting early intraconal inflammatory changes well. 6. 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 7. Subtle low density along the anterior inferior aspect the right  frontal lobe raises the possibility of intracranial inflammatory change is well. Recommend MRI of the brain for further evaluation as this may be artifactual on CT. These results were called by telephone at the time of interpretation on 02/14/2016 at 10:18 am to Dr. Newman Pies , who verbally acknowledged these results. Electronically Signed   By: Marin Roberts M.D.   On: 02/14/2016 10:24     Scheduled Meds: . ampicillin-sulbactam (UNASYN) IV  1.5 g Intravenous Q6H  . dorzolamide-timolol  1 drop Right Eye BID  . enoxaparin (LOVENOX) injection  70 mg Subcutaneous Q24H  . fluticasone  1 spray Each Nare BID  . vancomycin  1,000 mg Intravenous Q12H   Continuous Infusions: . sodium chloride 125 mL/hr at 02/15/16 0823       Time Spent in minutes   30 minutes  Jahrel Borthwick D.O.  on 02/15/2016 at 11:29 AM  Between 7am to 7pm - Pager - [phone]  After 7pm go to [email or web address] - password [email or web address]  And look for the night coverage person covering for me after hours  [facility] Office  [phone]

## 2016-02-16 LAB — CBC
HCT: 43.2 % (ref 39.0–52.0)
HEMOGLOBIN: 13.9 g/dL (ref 13.0–17.0)
MCH: 28.1 pg (ref 26.0–34.0)
MCHC: 32.2 g/dL (ref 30.0–36.0)
MCV: 87.3 fL (ref 78.0–100.0)
Platelets: 217 10*3/uL (ref 150–400)
RBC: 4.95 MIL/uL (ref 4.22–5.81)
RDW: 14.5 % (ref 11.5–15.5)
WBC: 10.4 10*3/uL (ref 4.0–10.5)

## 2016-02-16 LAB — BASIC METABOLIC PANEL
Anion gap: 7 (ref 5–15)
BUN: 9 mg/dL (ref 6–20)
CHLORIDE: 107 mmol/L (ref 101–111)
CO2: 20 mmol/L — AB (ref 22–32)
CREATININE: 1.33 mg/dL — AB (ref 0.61–1.24)
Calcium: 9 mg/dL (ref 8.9–10.3)
GFR calc Af Amer: >60 mL/min (ref >60)
GFR calc non Af Amer: >60 mL/min (ref >60)
Glucose, Bld: 112 mg/dL — ABNORMAL HIGH (ref 65–99)
POTASSIUM: 3.9 mmol/L (ref 3.5–5.1)
Sodium: 134 mmol/L — ABNORMAL LOW (ref 135–145)

## 2016-02-16 MED ORDER — ACETAZOLAMIDE ER 500 MG PO CP12
500.0000 mg | ORAL_CAPSULE | Freq: Two times a day (BID) | ORAL | Status: DC
  Filled 2016-02-16: qty 1

## 2016-02-16 MED ORDER — ACETAZOLAMIDE ER 500 MG PO CP12: 500.0000 mg | ORAL_CAPSULE | Freq: Two times a day (BID) | ORAL | Status: DC

## 2016-02-16 NOTE — Progress Notes (Signed)
Ophthalmology Progress Note  Hector Lee,Hector Lee, 45 y.o. male Date of Service: 02/16/2016 [id number])  HPI/Interval history: [name] is a 45 year old man with severe sinus disease who presented with post-septal cellulitis on the right 2/2 extension from frontal/ethmoidal sinusitis. He was started on IV vanc/Unasyn with minimal improvement. He subsequently underwent repeat CT, which showed a new abscess formation, and is now s/p OR decompression/drainage on 02/14/2016 (evening). He feels pain is much better, but swelling is about the same now POD#2 s/p orbital decompression. VA is still very blurry even when the right eyelids are held open.   Inpatient Meds: . acetaZOLAMIDE  500 mg Oral Daily  . ampicillin-sulbactam (UNASYN) IV  1.5 g Intravenous Q6H  . dorzolamide-timolol  1 drop Right Eye BID  . enoxaparin (LOVENOX) injection  70 mg Subcutaneous Q24H  . fluticasone  1 spray Each Nare BID  . vancomycin  1,000 mg Intravenous Q12H     Exam: Filed Vitals:   02/15/16 2131 02/16/16 0424  BP: 150/79 148/74  Pulse: 62 61  Temp: 98.6 F (37 C) 99.1 F (37.3 C)  Resp: 18 18    Visual Acuity:  near   OD HM   OS 20/25     OD OS  EOM (Primary) -3 in all gazes (slightly better compared to prior) Full  Lids/Lashes Stable upper and lower lid edema, fluctuance nasally upper inner lid Normal  Conjunctiva 3+ chemosis with subconjunctival hemorrhage (slightly worse) White, quiet  Pupils  4 --> 3, sluggish, +rAPD 4 --> 2, somewhat brisk, no rAPD  Cornea  Clear Clear  Anterior Chamber Formed Formed  Lens:  Clear Clear  IOP 41 Soft/normal to digital palpation  Fundus - Dilated NO - see initial consult note for dilated exam     A/P:  45 y.o. male with history of sinus disease presents with 2-day history of rapidly progressive swelling right eyelids --> right orbital cellulitis.  1) Right orbital cellulitis - s/p  orbital decompression/drainage on 02/14/2016 (evening) - minimal improvement - EOM is still ~-3 in all gazes. - Pain is better. - IOP is still 37 mmHg on Diamox 500 mg PO BID. Would recommend increasing to BID renal function permitting. - Continue vanc/Unasyn.  2) Ocular hypertension OD - Continue Cosopt BID OD.  I am concerned that [name] has a new worsening abscess nasally that is simply not responding to IV antibiotics. [facility] get [facility] ocul[facility]astics evaluation for presumed urgent decompression. I will try to contact Dr. Ralene MuskratMolly Fuller directly to preempt her on the situation. Recommend NPO for now in case he is able to go to surgery today.  Hector Fabian SharpScott Kaleea Penner, MD

## 2016-02-16 NOTE — Discharge Instructions (Signed)
Orbital Cellulitis Orbital cellulitis is an infection in the eye socket (orbit) and the tissues that surround the eye. The infection can spread to the eyelids, eyebrow area, and cheek. It can also cause a pocket of pus to develop around the eye (orbital abscess). In severe cases, the infection can spread to the brain. Orbital cellulitis is a medical emergency. CAUSES The most common cause of this condition is a bacterial infection. The infection usually spreads to the eye socket from another part of the body. The infection may start in:  The nose or sinuses.  The eyelids.  Facial skin.  The bloodstream. RISK FACTORS This condition is more likely to develop in people who have recently had one of the following:  Upper respiratory infection.  Sinus infection.  Eyelid or facial infection.  Eye injury.  Infection that affects the entire body or the bloodstream (systemic infection). SYMPTOMS Symptoms of this condition usually start quickly. Symptoms include:  Eye pain that gets worse with eye movement.  Swelling around the eye.  Eye redness.  Bulging of the eye.  Inability to move the eye.  Double vision.  Fever. DIAGNOSIS This condition may be diagnosed based on your symptoms and an eye exam. You may also have tests to confirm the diagnosis and to check for an orbital abscess. Other tests (cultures) may be done to find out what type of bacteria is causing the infection. Tests may include:  Complete blood count (CBC).  Blood culture.  Nose, sinus, or throat culture.  Imaging studies such as a CT scan or MRI. TREATMENT This condition is usually treated in a hospital. Antibiotic medicines are given directly into a vein through an IV tube.  At first, you may get IV antibiotics to kill bacteria that often cause orbital cellulitis (broad spectrum antibiotics).  Your medicine may be changed if cultures suggest that another antibiotic would be better.  If the IV  antibiotics are working to treat your infection, you may be switched to oral antibiotics and allowed to go home.  In some cases, surgery may be needed to drain an orbital abscess. HOME CARE INSTRUCTIONS  Take medicines only as directed by your health care provider.  Take your antibiotic medicine as directed by your health care provider. Finish the antibiotic even if you start to feel better.  Return to your normal activities as directed by your health care provider. Ask your health care provider what activities are safe for you.  Keep all follow-up visits as directed by your health care provider. This is important. SEEK IMMEDIATE MEDICAL CARE IF:  Your eye pain or swelling returns or it gets worse.  You have any changes in your vision.  You have a fever.   This information is not intended to replace advice given to you by your health care provider. Make sure you discuss any questions you have with your health care provider.   Document Released: 08/23/2001 Document Revised: 01/13/2015 Document Reviewed: 08/25/2014 Elsevier Interactive Patient Education 2016 [employer]  

## 2016-02-16 NOTE — Anesthesia Postprocedure Evaluation (Signed)
Anesthesia Post Note  Patient: Angeline SlimMichael H Dudek  Procedure(s) Performed: Procedure(s) (LRB): ENDOSCOPIC RIGHT MEDIAL ORBITAL WALL DECOMPRESSION (Right)  Patient location during evaluation: PACU Anesthesia Type: General Level of consciousness: awake and alert and patient cooperative Pain management: pain level controlled Vital Signs Assessment: post-procedure vital signs reviewed and stable Respiratory status: spontaneous breathing and respiratory function stable Cardiovascular status: stable Anesthetic complications: no    Last Vitals:  Filed Vitals:   02/16/16 0424 02/16/16 1000  BP: 148/74 132/74  Pulse: 61 55  Temp: 37.3 C 37.4 C  Resp: 18 18    Last Pain:  Filed Vitals:   02/16/16 1104  PainSc: 4                  Suresh Audi S

## 2016-02-16 NOTE — Progress Notes (Signed)
Subjective: Right eye pain improved. Still has significant swelling and limited vision.  Objective: Vital signs in last 24 hours: Temp:  [98.6 F (37 C)-99.4 F (37.4 C)] 99.1 F (37.3 C) (06/06 0424) Pulse Rate:  [58-62] 61 (06/06 0424) Resp:  [18-19] 18 (06/06 0424) BP: (137-150)/(74-79) 148/74 mmHg (06/06 0424) SpO2:  [96 %-99 %] 96 % (06/06 0424)  Physical Exam: General: Awake, alert, and oriented. Eyes: Edema of right upper lid. Left pupil, conjunctiva and lid normal. Blurry vision on the right. Right EOM slightly improved. Ears: Normal auricles and EACs. Nose: Moist and congested mucosa bilaterally. No bleeding. Mouth: Mucous membranes are moist. Posterior pharynx clear of any exudate or lesions.Normal dentition.  Neck: normal, supple, no masses Respiratory: Normal respiratory effort. No accessory muscle use.  Cardiovascular: Regular rate and rhythm.   Skin: no rashes, lesions, ulcers.  Psychiatric: Normal judgment and insight. Alert and oriented x 3. Normal mood.    Recent Labs  02/14/16 0342 02/15/16 0536  WBC 11.3* 12.3*  HGB 12.9* 13.0  HCT 40.5 40.1  PLT 192 191    Recent Labs  02/14/16 0342 02/15/16 0536  NA 135 136  K 3.5 4.0  CL 101 102  CO2 25 28  GLUCOSE 126* 128*  BUN 11 7  CREATININE 1.41* 1.36*  CALCIUM 8.8* 8.7*    Medications:  I have reviewed the patient's current medications. Scheduled: . acetaZOLAMIDE  500 mg Oral Daily  . ampicillin-sulbactam (UNASYN) IV  1.5 g Intravenous Q6H  . dorzolamide-timolol  1 drop Right Eye BID  . enoxaparin (LOVENOX) injection  70 mg Subcutaneous Q24H  . fluticasone  1 spray Each Nare BID  . vancomycin  1,000 mg Intravenous Q12H   ZOX:[id number]:acetaminophen **OR** acetaminophen, bisacodyl, HYDROcodone-acetaminophen, morphine injection, ondansetron **OR** ondansetron (ZOFRAN) IV, polyethylene glycol  Assessment/Plan: Right orbital cellulitis/abscess, secondary to acute right frontal and ethmoid sinusitis.  POD #2 s/p surgical drainage of the right ethmoid and frontal sinuses with decompression of the medial orbital wall. Unfortunately still very symptomatic. Will need transfer to a tertiary care oculoplastic service.   LOS: 1 day   Hector Lee,SUI W 02/16/2016, 7:45 AM

## 2016-02-16 NOTE — Discharge Summary (Signed)
Physician Discharge Summary  Hector Lee Neitzke WUJ:[id number] DOB: February 14, 1971 DOA: 02/13/2016  PCP: Oliver BarreJames John, MD  Admit date: 02/13/2016 Discharge date: 02/16/2016  Time spent: 45 minutes  Recommendations for Outpatient Follow-up:  Patient will be discharged to home, follow up [facility]- Dr. Carilyn GoodpastureYeatts.  Patient will need to follow up with primary care provider within one week of discharge.  Patient should remain nothing by mouth (NPO) for now.   Discharge Diagnoses:  Sepsis secondary to Right orbital cellulitis/Right frontal and ethmoid sinusitis  ? Chronic kidney disease, stage II  Discharge Condition: Stable  Diet recommendation: Heart healthy  Filed Weights   02/13/16 1017 02/13/16 1303  Weight: 136.079 kg (300 lb) 136.079 kg (300 lb)    History of present illness:  on 02/13/2016 by Ms. Willette ClusterPaula Guenther, NP Hector Lee Dwight is a 45 y.o. male with no significant past medical history . A few days ago patient developed some nasal congestion, scratchy voice. Yesterday wife noticed right eye was slightly red which is not unusual for patient. He does give a history of environmental/seasonal allergies. Around 3 AM patient woke up with discomfort in right eye. The right eye was markedly swollen. Denies recent trauma to the eye. He does have a slight frontal headache today.  Hospital Course:  Sepsis secondary to Right orbital cellulitis/Right frontal and ethmoid sinusitis -Upon admission, patient mildly tachycardic with a rate of 93, mild temperature of 100.12F. Today patient has leukocytosis. -CT orbits on 02/13/2016: Right orbital post-septal cellulitis from extension of right frontal and right ethmoid sinus disease, no orbital abscess identified -Ophthalmology and ENT consulted and appreciated -Repeat CT ORBIT 02/14/2016: Progressive post-septal orbital cellulitis associated with acute right frontal and ethmoid sinusitis, increasing extraconal post-septal abscess with  mass effect on the right orbit, abscess 6 x 16 x 18 mm -was placed on IV vancomycin and Unasyn -Patient noted to have IOP 37, started on Diamox 500 mg by mouth twice a day. -S/p surgical drainage of the right ethmoid and frontal sinuses with decompression of the medial orbital wall -Continue pain control and flonase -Despite Diamox as well as antibiotics, it appears that patient may have a worsening abscess which is not responding to treatment. Patient may need to undergo urgent decompression. -Up with ophthalmologist, Dr.Groat, who requested patient be transferred to [facility]. Patient will be going to the emergency department there and seeing Dr. Carilyn GoodpastureYeatts. Initially called for transfer [facility] does not have beds, and the hospitalist also stated that this was an appropriate neurological emergency, the patient should present to the emergency department. -Patient will be discharged.  ? Chronic kidney disease, stage II -Creatinine currently 1.33, was 1.4 in 2014 -Continue to monitor BMP  Consultants ENT Ophthalmology  Procedures  surgical drainage of the right ethmoid and frontal sinuses with decompression of the medial orbital wall  Discharge Exam: Filed Vitals:   02/15/16 2131 02/16/16 0424  BP: 150/79 148/74  Pulse: 62 61  Temp: 98.6 F (37 C) 99.1 F (37.3 C)  Resp: 18 18   Exam  General: Well developed, well nourished, NAD  HEENT: NCAT, Marked edema of the right orbit with thin drainage, mucous membranes moist.   Cardiovascular: S1 S2 auscultated, no rubs, murmurs or gallops. Regular rate and rhythm.  Respiratory: Clear to auscultation bilaterally  Abdomen: Soft, nontender, nondistended, + bowel sounds  Extremities: warm dry without cyanosis clubbing or edema  Neuro: AAOx3, nonfocal, able to move right eye more  Psych: Normal affect and demeanor, pleasant  Discharge Instructions      Discharge Instructions     Discharge instructions    Complete by:  As directed   Patient will be discharged to home, follow up with [facility]- Dr. Carilyn Goodpasture.  Patient will need to follow up with primary care provider within one week of discharge.  Patient should remain nothing by mouth (NPO) for now.            Medication List    STOP taking these medications        aspirin-sod bicarb-citric acid 325 MG Tbef tablet  Commonly known as:  ALKA-SELTZER     tetrahydrozoline 0.05 % ophthalmic solution       No Known Allergies Follow-up Information    Go to [facility], [place].   Specialty:  Ophthalmology   Why:  Immediately   Contact information:   [address] [place] [address] [phone]        The results of significant diagnostics from this hospitalization (including imaging, microbiology, ancillary and laboratory) are listed below for reference.    Significant Diagnostic Studies: Ct Orbits W/cm  02/14/2016  CLINICAL DATA:  Right postseptal cellulitis readmitted yesterday. The patient has been a antibiotics. He states the swelling is unchanged but his right-sided vision his blurry ear. Postseptal cellulitis secondary to acute right frontal and ethmoid sinusitis with purulent drainage. Decreased right sided extraocular motion. Surgical drainage gland. EXAM: CT ORBITS WITH CONTRAST TECHNIQUE: Multidetector CT imaging of the orbits was performed following the bolus administration of intravenous contrast. CONTRAST:  1 ISOVUE-300 IOPAMIDOL (ISOVUE-300) INJECTION 61% COMPARISON:  CT orbits with contrast yesterday. FINDINGS: Postseptal right orbital cellulitis as extension from the acute right frontal and maxillary sinusitis is again noted. The extra conal abscess evident on yesterday's scan has increased in size, now measuring 6 x 16 x 18 mm. This compares with 4 x 14 x 13 mm yesterday. There is increased mass effect on the right orbit. Extra conal soft tissue inflammatory changes  are noted superiorly as well extending to the lateral wall of the orbit. This is slightly more prominent than on yesterday's exam . Intraorbital mass effect is increased with progressive exophthalmos. There is no inflammatory change associated with the superior rectus muscle which was less evident on yesterday's exam. Perforation of the medial orbital wall by the right ethmoid and frontal sinus is again noted. There is diffuse opacification of the right frontal and anterior ethmoid air cells. The area appears be encapsulated by a calcified rim the suggesting a frontal mucocele which has expanded into the anterior ethmoids. This then results in secondary obstruction of the residual anterior ethmoid and frontal sinuses. Mild diffuse mucosal thickening is again seen within the maxillary sinuses bilaterally. Prominent polyps or mucous retention cysts are noted anteriorly and inferiorly in the left frontal sinuses maxillary sinus. There is mild mucosal thickening throughout the left ethmoid air cells and inferior left frontal sinus. Mild mucosal thickening is also noted within the anterior left sphenoid sinus. Subtle low density along the anterior inferior right frontal lobe may be artifact. Early encephalitis cannot be excluded on the basis of CT. MRI brain with contrast is recommended for further evaluation. IMPRESSION: 1. Progressive postseptal orbital cellulitis associated with acute right frontal and ethmoid sinusitis. 2. Increasing extraconal postseptal abscess with mass effect on the right orbit. The abscess now measures 6 x 16 x 18 mm. 3. Increased diffuse extraconal inflammatory changes over the superior  aspect of the right orbit. 4. Increasing exophthalmos. 5. Progressive inflammatory changes associated with the right superior rectus muscle suggesting early intraconal inflammatory changes well. 6. Peripherally calcified tissue within the anterior ethmoid air cells and right frontal sinus suggesting a frontal  mucocele as the underlying etiology of obstruction and sinusitis. 7. Subtle low density along the anterior inferior aspect the right frontal lobe raises the possibility of intracranial inflammatory change is well. Recommend MRI of the brain for further evaluation as this may be artifactual on CT. These results were called by telephone at the time of interpretation on 02/14/2016 at 10:18 am to Dr. Newman Pies , who verbally acknowledged these results. Electronically Signed   By: Marin Roberts M.D.   On: 02/14/2016 10:24   Ct Orbits W/cm  02/13/2016  CLINICAL DATA:  Right periorbital swelling. Nasal and sinus congestion. EXAM: CT ORBITS WITH CONTRAST TECHNIQUE: Multidetector CT imaging of the orbits was performed following the bolus administration of intravenous contrast. CONTRAST:  75 mL Isovue-300 COMPARISON:  None. FINDINGS: The right frontal sinus and anterior right ethmoid air cells are completely opacified. There is osseous dehiscence involving the inferolateral wall of the right frontal sinus and lamina papyracea. Soft tissue and stranding extends from the sinuses into the superior and medial extraconal space of the right orbit. No organized abscess is identified. There is mild right proptosis with mild stretching of the right optic nerve. There is also mild-to-moderate right preseptal periorbital soft tissue swelling. Moderate, partially polypoid left maxillary sinus mucosal thickening is partially visualized. There is also mild left frontal, left ethmoid, and bilateral sphenoid sinus mucosal thickening. No sinus air-fluid levels are present. There is at most a trace left mastoid effusion. The visualized right mastoid air cells are clear. The visualized portion of the brain is unremarkable. No epidural collection is identified adjacent to the right-sided sinus disease. IMPRESSION: Right orbital postseptal cellulitis from extension of right frontal and right ethmoid sinus disease. No orbital abscess  identified. These results were called by telephone at the time of interpretation on 02/13/2016 at 9:17 am to Apolonio Schneiders, who verbally acknowledged these results. Electronically Signed   By: Sebastian Ache M.D.   On: 02/13/2016 09:20    Microbiology: Recent Results (from the past 240 hour(s))  Aerobic/Anaerobic Culture(surg specimen) (NOT AT [facility])     Status: None (Preliminary result)   Collection Time: 02/14/16  8:15 PM  Result Value Ref Range Status   Specimen Description ABSCESS  Final   Special Requests NONE  Final   Gram Stain   Final    RARE WBC PRESENT, PREDOMINANTLY PMN NO ORGANISMS SEEN    Culture TOO YOUNG TO READ  Final   Report Status PENDING  Incomplete     Labs: Basic Metabolic Panel:  Recent Labs Lab 02/13/16 0844 02/14/16 0342 02/15/16 0536 02/16/16 0656  NA 141 135 136 134*  K 4.4 3.5 4.0 3.9  CL 102 101 102 107  CO2  --  25 28 20*  GLUCOSE 101* 126* 128* 112*  BUN CREATININE 1.50* 1.41* 1.36* 1.33*  CALCIUM  --  8.8* 8.7* 9.0   Liver Function Tests: No results for input(s): AST, ALT, ALKPHOS, BILITOT, PROT, ALBUMIN in the last 168 hours. No results for input(s): LIPASE, AMYLASE in the last 168 hours. No results for input(s): AMMONIA in the last 168 hours. CBC:  Recent Labs Lab 02/13/16 0844 02/13/16 0940 02/14/16 0342 02/15/16 0536 02/16/16 0656  WBC  --  10.1 11.3* 12.3* 10.4  NEUTROABS  --  7.7  --   --   --   HGB 15.6 14.1 12.9* 13.0 13.9  HCT 46.0 44.1 40.5 40.1 43.2  MCV  --  88.2 87.1 87.4 87.3  PLT  --  185 192 191 217   Cardiac Enzymes: No results for input(s): CKTOTAL, CKMB, CKMBINDEX, TROPONINI in the last 168 hours. BNP: BNP (last 3 results) No results for input(s): BNP in the last 8760 hours.  ProBNP (last 3 results) No results for input(s): PROBNP in the last 8760 hours.  CBG: No results for input(s): GLUCAP in the last 168 hours.     SignedEdsel Petrin  [facility] 02/16/2016, 10:41  AM

## 2016-02-16 NOTE — Progress Notes (Signed)
Pt discharged to [facility] hospital for further evaluation. Pt condition stable. Discharge instructions given to pt and his partner before discharge with no concerns voiced. Pt 's partner provided transportation to [facility]

## 2016-02-18 ENCOUNTER — Encounter: Payer: Self-pay | Admitting: Ophthalmology

## 2016-02-19 LAB — AEROBIC/ANAEROBIC CULTURE (SURGICAL/DEEP WOUND)

## 2016-02-19 LAB — AEROBIC/ANAEROBIC CULTURE W GRAM STAIN (SURGICAL/DEEP WOUND)

## 2016-02-22 ENCOUNTER — Encounter: Payer: Self-pay | Admitting: Ophthalmology

## 2016-03-11 ENCOUNTER — Telehealth: Payer: Self-pay | Admitting: Internal Medicine

## 2016-03-11 NOTE — Telephone Encounter (Signed)
Patient moved to [place] and has moved back.  It has almost been 3 years since he was last seen.  It looks like he was a patient of Dr. Posey ReaPlotnikov.  He would like to establish care back.  Can we do this?  He is also needing a hospital fu.

## 2016-03-12 NOTE — Telephone Encounter (Signed)
Ok Thx 

## 2016-03-24 ENCOUNTER — Telehealth: Payer: Self-pay

## 2016-03-24 NOTE — Telephone Encounter (Signed)
PFYI: Patient called and said that [facility] would be faxing over about 80 pages of this medical history.

## 2016-03-24 NOTE — Telephone Encounter (Signed)
Noted, though not sure this is needed as we also have care everywhere access  So may not need to be scanned to record

## 2016-03-25 ENCOUNTER — Encounter: Payer: Self-pay | Admitting: *Deleted

## 2016-03-25 ENCOUNTER — Ambulatory Visit (INDEPENDENT_AMBULATORY_CARE_PROVIDER_SITE_OTHER): Payer: BLUE CROSS/BLUE SHIELD | Admitting: Internal Medicine

## 2016-03-25 ENCOUNTER — Encounter: Payer: Self-pay | Admitting: Internal Medicine

## 2016-03-25 VITALS — BP 118/74 | HR 82 | Ht 75.0 in | Wt 303.0 lb

## 2016-03-25 DIAGNOSIS — I10 Essential (primary) hypertension: Secondary | ICD-10-CM

## 2016-03-25 DIAGNOSIS — R030 Elevated blood-pressure reading, without diagnosis of hypertension: Secondary | ICD-10-CM

## 2016-03-25 DIAGNOSIS — H5461 Unqualified visual loss, right eye, normal vision left eye: Secondary | ICD-10-CM

## 2016-03-25 DIAGNOSIS — E669 Obesity, unspecified: Secondary | ICD-10-CM

## 2016-03-25 DIAGNOSIS — IMO0001 Reserved for inherently not codable concepts without codable children: Secondary | ICD-10-CM

## 2016-03-25 NOTE — Progress Notes (Signed)
Subjective:  Patient ID: Hector Lee, male    DOB: 19-May-1971  Age: 45 y.o. MRN: [id number]  CC: No chief complaint on file.   HPI   Hector SlimMichael H Frentz presents to re-establish. C/o loss of vision in the R eye due to recent severe orbital cellulitis triggered by sinusitis (s/p 2 ENT surgeries) d[facility] on 02/21/16 F/u DM on diet, elevated BP  No outpatient prescriptions prior to visit.   No facility-administered medications prior to visit.    ROS Review of Systems  Constitutional: Negative for appetite change, fatigue and unexpected weight change.  HENT: Negative for congestion, nosebleeds, sneezing, sore throat and trouble swallowing.   Eyes: Positive for pain. Negative for discharge, itching and visual disturbance.  Respiratory: Negative for cough.   Cardiovascular: Negative for chest pain, palpitations and leg swelling.  Gastrointestinal: Negative for nausea, diarrhea, blood in stool and abdominal distention.  Genitourinary: Negative for frequency and hematuria.  Musculoskeletal: Negative for back pain, joint swelling, gait problem and neck pain.  Skin: Negative for rash.  Neurological: Negative for dizziness, tremors, speech difficulty and weakness.  Psychiatric/Behavioral: Negative for suicidal ideas, sleep disturbance, dysphoric mood and agitation. The patient is not nervous/anxious.     Objective:  BP 118/74 mmHg  Pulse 82  Ht 6\' 3"  (1.905 m)  Wt 303 lb (137.44 kg)  BMI 37.87 kg/m2  SpO2 97%  BP Readings from Last 3 Encounters:  03/25/16 118/74  02/16/16 132/74  06/15/13 118/76    Wt Readings from Last 3 Encounters:  03/25/16 303 lb (137.44 kg)  02/13/16 300 lb (136.079 kg)  04/05/13 314 lb (142.429 kg)    Physical Exam  Constitutional: He is oriented to person, place, and time. He appears well-developed. No distress.  NAD  HENT:  Mouth/Throat: Oropharynx is clear and moist.  Eyes: Conjunctivae are normal. Pupils are equal, round, and  reactive to light. Right eye exhibits no discharge. No scleral icterus.  Neck: Normal range of motion. No JVD present. No thyromegaly present.  Cardiovascular: Normal rate, regular rhythm, normal heart sounds and intact distal pulses.  Exam reveals no gallop and no friction rub.   No murmur heard. Pulmonary/Chest: Effort normal and breath sounds normal. No respiratory distress. He has no wheezes. He has no rales. He exhibits no tenderness.  Abdominal: Soft. Bowel sounds are normal. He exhibits no distension and no mass. There is no tenderness. There is no rebound and no guarding.  Musculoskeletal: Normal range of motion. He exhibits no edema or tenderness.  Lymphadenopathy:    He has no cervical adenopathy.  Neurological: He is alert and oriented to person, place, and time. He has normal reflexes. No cranial nerve deficit. He exhibits normal muscle tone. He displays a negative Romberg sign. Coordination and gait normal.  Skin: Skin is warm and dry. No rash noted.  Psychiatric: He has a normal mood and affect. His behavior is normal. Judgment and thought content normal.  R eye - no vision  Lab Results  Component Value Date   WBC 10.4 02/16/2016   HGB 13.9 02/16/2016   HCT 43.2 02/16/2016   PLT 217 02/16/2016   GLUCOSE 112* 02/16/2016   CHOL 237* 04/09/2013   TRIG 129.0 04/09/2013   HDL 37.00* 04/09/2013   LDLDIRECT 187.4 04/09/2013   ALT 30 04/09/2013   AST 35 04/09/2013   NA 134* 02/16/2016   K 3.9 02/16/2016   CL 107 02/16/2016   CREATININE 1.33* 02/16/2016   BUN 9 02/16/2016  CO2 20* 02/16/2016   TSH 1.25 04/09/2013    Ct Orbits W/cm  02/14/2016  CLINICAL DATA:  Right postseptal cellulitis readmitted yesterday. The patient has been a antibiotics. He states the swelling is unchanged but his right-sided vision his blurry ear. Postseptal cellulitis secondary to acute right frontal and ethmoid sinusitis with purulent drainage. Decreased right sided extraocular motion. Surgical  drainage gland. EXAM: CT ORBITS WITH CONTRAST TECHNIQUE: Multidetector CT imaging of the orbits was performed following the bolus administration of intravenous contrast. CONTRAST:  1 ISOVUE-300 IOPAMIDOL (ISOVUE-300) INJECTION 61% COMPARISON:  CT orbits with contrast yesterday. FINDINGS: Postseptal right orbital cellulitis as extension from the acute right frontal and maxillary sinusitis is again noted. The extra conal abscess evident on yesterday's scan has increased in size, now measuring 6 x 16 x 18 mm. This compares with 4 x 14 x 13 mm yesterday. There is increased mass effect on the right orbit. Extra conal soft tissue inflammatory changes are noted superiorly as well extending to the lateral wall of the orbit. This is slightly more prominent than on yesterday's exam . Intraorbital mass effect is increased with progressive exophthalmos. There is no inflammatory change associated with the superior rectus muscle which was less evident on yesterday's exam. Perforation of the medial orbital wall by the right ethmoid and frontal sinus is again noted. There is diffuse opacification of the right frontal and anterior ethmoid air cells. The area appears be encapsulated by a calcified rim the suggesting a frontal mucocele which has expanded into the anterior ethmoids. This then results in secondary obstruction of the residual anterior ethmoid and frontal sinuses. Mild diffuse mucosal thickening is again seen within the maxillary sinuses bilaterally. Prominent polyps or mucous retention cysts are noted anteriorly and inferiorly in the left frontal sinuses maxillary sinus. There is mild mucosal thickening throughout the left ethmoid air cells and inferior left frontal sinus. Mild mucosal thickening is also noted within the anterior left sphenoid sinus. Subtle low density along the anterior inferior right frontal lobe may be artifact. Early encephalitis cannot be excluded on the basis of CT. MRI brain with contrast is  recommended for further evaluation. IMPRESSION: 1. Progressive postseptal orbital cellulitis associated with acute right frontal and ethmoid sinusitis. 2. Increasing extraconal postseptal abscess with mass effect on the right orbit. The abscess now measures 6 x 16 x 18 mm. 3. Increased diffuse extraconal inflammatory changes over the superior aspect of the right orbit. 4. Increasing exophthalmos. 5. Progressive inflammatory changes associated with the right superior rectus muscle suggesting early intraconal inflammatory changes well. 6. 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 7. Subtle low density along the anterior inferior aspect the right frontal lobe raises the possibility of intracranial inflammatory change is well. Recommend MRI of the brain for further evaluation as this may be artifactual on CT. These results were called by telephone at the time of interpretation on 02/14/2016 at 10:18 am to Dr. Newman Pies , who verbally acknowledged these results. Electronically Signed   By: Marin Roberts M.D.   On: 02/14/2016 10:24   Ct Orbits W/cm  02/13/2016  CLINICAL DATA:  Right periorbital swelling. Nasal and sinus congestion. EXAM: CT ORBITS WITH CONTRAST TECHNIQUE: Multidetector CT imaging of the orbits was performed following the bolus administration of intravenous contrast. CONTRAST:  75 mL Isovue-300 COMPARISON:  None. FINDINGS: The right frontal sinus and anterior right ethmoid air cells  are completely opacified. There is osseous dehiscence involving the inferolateral wall of the right frontal sinus and lamina papyracea. Soft tissue and stranding extends from the sinuses into the superior and medial extraconal space of the right orbit. No organized abscess is identified. There is mild right proptosis with mild stretching of the right optic nerve. There is also  mild-to-moderate right preseptal periorbital soft tissue swelling. Moderate, partially polypoid left maxillary sinus mucosal thickening is partially visualized. There is also mild left frontal, left ethmoid, and bilateral sphenoid sinus mucosal thickening. No sinus air-fluid levels are present. There is at most a trace left mastoid effusion. The visualized right mastoid air cells are clear. The visualized portion of the brain is unremarkable. No epidural collection is identified adjacent to the right-sided sinus disease. IMPRESSION: Right orbital postseptal cellulitis from extension of right frontal and right ethmoid sinus disease. No orbital abscess identified. These results were called by telephone at the time of interpretation on 02/13/2016 at 9:17 am to Apolonio Schneiders, who verbally acknowledged these results. Electronically Signed   By: Sebastian Ache M.D.   On: 02/13/2016 09:20    Assessment & Plan:   There are no diagnoses linked to this encounter. I am having Mr. Roxan Hockey maintain his multivitamin and LUTEIN PO.  Meds ordered this encounter  Medications  . Multiple Vitamin (MULTIVITAMIN) tablet    Sig: Take 1 tablet by mouth daily.  . LUTEIN PO    Sig: Take by mouth daily.     Follow-up: No Follow-up on file.  Sonda Primes, MD

## 2016-03-25 NOTE — Progress Notes (Signed)
Pre visit review using our clinic review tool, if applicable. No additional management support is needed unless otherwise documented below in the visit note. 

## 2016-03-25 NOTE — Assessment & Plan Note (Signed)
NAS diet 

## 2016-03-25 NOTE — Assessment & Plan Note (Signed)
Loose wt 

## 2016-06-27 ENCOUNTER — Ambulatory Visit: Payer: BLUE CROSS/BLUE SHIELD | Admitting: Internal Medicine

## 2016-10-10 DIAGNOSIS — H3411 Central retinal artery occlusion, right eye: Secondary | ICD-10-CM | POA: Diagnosis not present

## 2016-10-10 DIAGNOSIS — H40012 Open angle with borderline findings, low risk, left eye: Secondary | ICD-10-CM | POA: Diagnosis not present

## 2016-10-10 DIAGNOSIS — H2512 Age-related nuclear cataract, left eye: Secondary | ICD-10-CM | POA: Diagnosis not present

## 2017-01-09 DIAGNOSIS — H2512 Age-related nuclear cataract, left eye: Secondary | ICD-10-CM | POA: Diagnosis not present

## 2017-01-09 DIAGNOSIS — H3411 Central retinal artery occlusion, right eye: Secondary | ICD-10-CM | POA: Diagnosis not present

## 2017-01-09 DIAGNOSIS — H40012 Open angle with borderline findings, low risk, left eye: Secondary | ICD-10-CM | POA: Diagnosis not present

## 2017-02-24 ENCOUNTER — Ambulatory Visit (INDEPENDENT_AMBULATORY_CARE_PROVIDER_SITE_OTHER): Payer: 59 | Admitting: Internal Medicine

## 2017-02-24 ENCOUNTER — Encounter: Payer: Self-pay | Admitting: Internal Medicine

## 2017-02-24 VITALS — BP 150/90 | HR 69 | Temp 98.0°F | Resp 16 | Ht 75.0 in | Wt 306.0 lb

## 2017-02-24 DIAGNOSIS — L739 Follicular disorder, unspecified: Secondary | ICD-10-CM

## 2017-02-24 MED ORDER — DOXYCYCLINE HYCLATE 100 MG PO TABS: 100.0000 mg | ORAL_TABLET | Freq: Two times a day (BID) | ORAL | 0 refills | Status: DC

## 2017-02-24 NOTE — Progress Notes (Signed)
Subjective:    Patient ID: Hector Lee, male    DOB: [birth year], 46 y.o.   MRN: [id number]  HPI Hector Lee is here for an acute visit.   Rash on neck:  The rash is on the posterior neck - both sides.  Hector Lee noticed it about 2.5 weeks ago.  It was scaley and red.  It does itch. Hector Lee has puttting cortisone 10 on it an it is not helping.  Hector Lee also tried an old prescription strength steroid cream and it did not help.     Hector Lee usually uses organic soaps.  Hector Lee does run in the woods.  Hector Lee denies new products, meds.  Hector Lee denies other symptoms.  Hector Lee denies any other skin lesions.   Hector Lee is unsure if it coincides with a hair cut.   Medications and allergies reviewed with patient and updated if appropriate.  Patient Active Problem List   Diagnosis Date Noted  . Vision loss of right eye 03/25/2016  . Periorbital cellulitis of right eye 02/13/2016  . Essential hypertension 02/13/2016  . AKI (acute kidney injury) (HCC) 02/13/2016  . Febrile illness 02/13/2016  . Well adult exam 04/05/2013  . Obesity, unspecified 04/05/2013  . Elevated BP 04/05/2013  . ELBOW PAIN 01/20/2010  . GANGLION 07/16/2009  . HYPERLIPIDEMIA 05/29/2008  . ALLERGIC RHINITIS 05/09/2007    Current Outpatient Prescriptions on File Prior to Visit  Medication Sig Dispense Refill  . LUTEIN PO Take by mouth daily.    . Multiple Vitamin (MULTIVITAMIN) tablet Take 1 tablet by mouth daily.     No current facility-administered medications on file prior to visit.     Past Medical History:  Diagnosis Date  . Hyperhydrosis disorder     Past Surgical History:  Procedure Laterality Date  . NASAL SINUS SURGERY Right 02/14/2016   Procedure: ENDOSCOPIC RIGHT MEDIAL ORBITAL WALL DECOMPRESSION;  Surgeon: Newman PiesSu Teoh, MD;  Location: [facility] OR;  Service: ENT;  Laterality: Right;  . NO PAST SURGERIES      Social History   Social History  . Marital status: Single    Spouse name: N/A  . Number of children: N/A  . Years of education: N/A   Social  History Main Topics  . Smoking status: Never Smoker  . Smokeless tobacco: Not on file  . Alcohol use Yes  . Drug use: No  . Sexual activity: Yes   Other Topics Concern  . Not on file   Social History Narrative  . No narrative on file    Family History  Problem Relation Age of Onset  . Diabetes Unknown        grandmother    Review of Systems  Constitutional: Negative for chills and fever.  Musculoskeletal: Negative for arthralgias and myalgias.  Skin: Negative for rash.  Neurological: Negative for light-headedness and headaches.       Objective:   Vitals:   02/24/17 1054  BP: (!) 150/90  Pulse: 69  Resp: 16  Temp: 98 F (36.7 C)   Filed Weights   02/24/17 1054  Weight: (!) 306 lb (138.8 kg)   Body mass index is 38.25 kg/m.  Wt Readings from Last 3 Encounters:  02/24/17 (!) 306 lb (138.8 kg)  03/25/16 (!) 303 lb (137.4 kg)  02/13/16 300 lb (136.1 kg)     Physical Exam  Constitutional: Hector Lee appears well-developed and well-nourished. No distress.  HENT:  Head: Normocephalic and atraumatic.  Skin: Hector Lee is not diaphoretic.  Maculopapular rash posterior neck extending  on both sides toward front, no blisters          Assessment & Plan:   See Problem List for Assessment and Plan of chronic medical problems.

## 2017-02-24 NOTE — Assessment & Plan Note (Signed)
Likely folliculitis Will try doxycycline x 7 days Zyrtec/claritin  Call if no improvement - will try stronger steroid cream

## 2017-02-24 NOTE — Patient Instructions (Addendum)
Take Claritin in the morning or Zyrtec at night for the itch if needed.  Take the antibiotic as directed.  Call if no improvement.

## 2017-03-06 DIAGNOSIS — J302 Other seasonal allergic rhinitis: Secondary | ICD-10-CM | POA: Diagnosis not present

## 2017-03-06 DIAGNOSIS — H5461 Unqualified visual loss, right eye, normal vision left eye: Secondary | ICD-10-CM | POA: Diagnosis not present

## 2017-03-06 DIAGNOSIS — J341 Cyst and mucocele of nose and nasal sinus: Secondary | ICD-10-CM | POA: Diagnosis not present

## 2017-03-06 DIAGNOSIS — Z9889 Other specified postprocedural states: Secondary | ICD-10-CM | POA: Diagnosis not present

## 2017-05-07 NOTE — Progress Notes (Signed)
Subjective:    Patient ID: Hector Lee, male    DOB: 07/19/1971, 46 y.o.   MRN: [id number]  HPI He is here for an acute visit and follow up of his chronic problems.   Spot on right foot itches and scaly:  Right foot started itching about two weeks ago.  The skin was rough.  It is better now, but still there. After showering it would itch worse He did not put anything on it but did change to an eczema soap and that has help.   It still itches.   Hypertension: He is taking his medication daily. He is compliant with a low sodium diet.  He denies chest pain, palpitations, edema, shortness of breath and regular headaches. He is exercising regularly.      Hyperlipidemia: He is controlling his cholesterol with lifestyle. He has made significant lifestyle change and would like his blood work checked.  He is compliant with a low fat/cholesterol diet. He is exercising regularly.     Hyperglycemia:  He is compliant with a low sugar/carbohydrate diet.  He is exercising regularly.  He has lost 25 lbs.      Medications and allergies reviewed with patient and updated if appropriate.  Patient Active Problem List   Diagnosis Date Noted  . Folliculitis 02/24/2017  . Vision loss of right eye 03/25/2016  . Periorbital cellulitis of right eye 02/13/2016  . Essential hypertension 02/13/2016  . AKI (acute kidney injury) (HCC) 02/13/2016  . Febrile illness 02/13/2016  . Well adult exam 04/05/2013  . Obesity, unspecified 04/05/2013  . Elevated BP 04/05/2013  . ELBOW PAIN 01/20/2010  . GANGLION 07/16/2009  . HYPERLIPIDEMIA 05/29/2008  . ALLERGIC RHINITIS 05/09/2007    Current Outpatient Prescriptions on File Prior to Visit  Medication Sig Dispense Refill  . LUTEIN PO Take by mouth daily.    . Multiple Vitamin (MULTIVITAMIN) tablet Take 1 tablet by mouth daily.     No current facility-administered medications on file prior to visit.     Past Medical History:  Diagnosis Date  .  Hyperhydrosis disorder     Past Surgical History:  Procedure Laterality Date  . NASAL SINUS SURGERY Right 02/14/2016   Procedure: ENDOSCOPIC RIGHT MEDIAL ORBITAL WALL DECOMPRESSION;  Surgeon: Newman Pies, MD;  Location: [facility] OR;  Service: ENT;  Laterality: Right;  . NO PAST SURGERIES      Social History   Social History  . Marital status: Single    Spouse name: N/A  . Number of children: N/A  . Years of education: N/A   Social History Main Topics  . Smoking status: Never Smoker  . Smokeless tobacco: Never Used  . Alcohol use Yes  . Drug use: No  . Sexual activity: Yes   Other Topics Concern  . None   Social History Narrative  . None    Family History  Problem Relation Age of Onset  . Diabetes Unknown        grandmother    Review of Systems  Constitutional: Negative for chills and fever.  Respiratory: Negative for cough, shortness of breath and wheezing.   Cardiovascular: Negative for chest pain, palpitations and leg swelling.  Skin: Positive for color change (red, scaly). Negative for rash.  Neurological: Negative for light-headedness, numbness and headaches.       Objective:   Vitals:   05/08/17 1541  BP: 134/84  Pulse: 80  Resp: 16  Temp: 98.7 F (37.1 C)  SpO2: 98%  Filed Weights   05/08/17 1541  Weight: (!) 306 lb (138.8 kg)   Body mass index is 38.25 kg/m.  Wt Readings from Last 3 Encounters:  05/08/17 (!) 306 lb (138.8 kg)  02/24/17 (!) 306 lb (138.8 kg)  03/25/16 (!) 303 lb (137.4 kg)     Physical Exam Constitutional: Appears well-developed and well-nourished. No distress.  HENT:  Head: Normocephalic and atraumatic.  Neck: Neck supple. No tracheal deviation present. No thyromegaly present.  No cervical lymphadenopathy Cardiovascular: Normal rate, regular rhythm and normal heart sounds.   No murmur heard. No carotid bruit .  No edema Pulmonary/Chest: Effort normal and breath sounds normal. No respiratory distress. No has no wheezes. No  rales.  Skin: Skin is warm and dry. Not diaphoretic. hyperpigmented region on right dorsal foot that is slightly scaly, no open wound, papules/blister Psychiatric: Normal mood and affect. Behavior is normal.         Assessment & Plan:   See Problem List for Assessment and Plan of chronic medical problems.

## 2017-05-08 ENCOUNTER — Ambulatory Visit (INDEPENDENT_AMBULATORY_CARE_PROVIDER_SITE_OTHER): Payer: 59 | Admitting: Internal Medicine

## 2017-05-08 ENCOUNTER — Encounter: Payer: Self-pay | Admitting: Internal Medicine

## 2017-05-08 VITALS — BP 134/84 | HR 80 | Temp 98.7°F | Resp 16 | Wt 306.0 lb

## 2017-05-08 DIAGNOSIS — R739 Hyperglycemia, unspecified: Secondary | ICD-10-CM | POA: Diagnosis not present

## 2017-05-08 DIAGNOSIS — E7800 Pure hypercholesterolemia, unspecified: Secondary | ICD-10-CM | POA: Diagnosis not present

## 2017-05-08 DIAGNOSIS — Z125 Encounter for screening for malignant neoplasm of prostate: Secondary | ICD-10-CM | POA: Diagnosis not present

## 2017-05-08 DIAGNOSIS — I10 Essential (primary) hypertension: Secondary | ICD-10-CM

## 2017-05-08 DIAGNOSIS — R21 Rash and other nonspecific skin eruption: Secondary | ICD-10-CM | POA: Insufficient documentation

## 2017-05-08 MED ORDER — TRIAMCINOLONE ACETONIDE 0.5 % EX OINT: 1.0000 "application " | TOPICAL_OINTMENT | Freq: Two times a day (BID) | CUTANEOUS | 0 refills | Status: DC

## 2017-05-08 NOTE — Patient Instructions (Signed)
Get fasting blood work done when you can.  Test(s) ordered today. Your results will be released to MyChart (or called to you) after review, usually within 72hours after test completion. If any changes need to be made, you will be notified at that same time.  All other Health Maintenance issues reviewed.   All recommended immunizations and age-appropriate screenings are up-to-date or discussed.  No immunizations administered today.   Medications reviewed and updated.  Changes include trying a steroid cream for your right foot.   Your prescription(s) have been submitted to your pharmacy. Please take as directed and contact our office if you believe you are having problem(s) with the medication(s).   Please followup with Dr Posey Rea

## 2017-05-08 NOTE — Assessment & Plan Note (Signed)
Check a1c Low sugar / carb diet Stressed regular exercise, weight loss  

## 2017-05-08 NOTE — Assessment & Plan Note (Signed)
Check psa 

## 2017-05-08 NOTE — Assessment & Plan Note (Signed)
Right dorsal foot Improved with using eczema cream, but still present Triamcinolone cream BID

## 2017-05-08 NOTE — Assessment & Plan Note (Signed)
BP good here today He is not on any medication Continue regular exercise, healthy diet and weight loss efforts Will check tsh,cmp

## 2017-05-08 NOTE — Assessment & Plan Note (Signed)
Check lipid panel  Regular exercise and healthy diet encouraged  

## 2017-06-13 ENCOUNTER — Other Ambulatory Visit (INDEPENDENT_AMBULATORY_CARE_PROVIDER_SITE_OTHER): Payer: 59

## 2017-06-13 DIAGNOSIS — E7800 Pure hypercholesterolemia, unspecified: Secondary | ICD-10-CM | POA: Diagnosis not present

## 2017-06-13 DIAGNOSIS — I10 Essential (primary) hypertension: Secondary | ICD-10-CM | POA: Diagnosis not present

## 2017-06-13 DIAGNOSIS — R739 Hyperglycemia, unspecified: Secondary | ICD-10-CM | POA: Diagnosis not present

## 2017-06-13 DIAGNOSIS — Z125 Encounter for screening for malignant neoplasm of prostate: Secondary | ICD-10-CM

## 2017-06-13 LAB — LIPID PANEL
CHOLESTEROL: 213 mg/dL — AB (ref 0–200)
HDL: 38 mg/dL — ABNORMAL LOW (ref >39.00)
LDL Cholesterol: 158 mg/dL — ABNORMAL HIGH (ref 0–99)
NonHDL: 175.36
TRIGLYCERIDES: 87 mg/dL (ref 0.0–149.0)
Total CHOL/HDL Ratio: 6
VLDL: 17.4 mg/dL (ref 0.0–40.0)

## 2017-06-13 LAB — COMPREHENSIVE METABOLIC PANEL
ALBUMIN: 4.3 g/dL (ref 3.5–5.2)
ALK PHOS: 68 U/L (ref 39–117)
ALT: 25 U/L (ref 0–53)
AST: 34 U/L (ref 0–37)
BUN: 16 mg/dL (ref 6–23)
CO2: 30 mEq/L (ref 19–32)
CREATININE: 1.55 mg/dL — AB (ref 0.40–1.50)
Calcium: 9.9 mg/dL (ref 8.4–10.5)
Chloride: 103 mEq/L (ref 96–112)
GFR: 62.48 mL/min (ref >60.00)
GLUCOSE: 103 mg/dL — AB (ref 70–99)
POTASSIUM: 4.5 meq/L (ref 3.5–5.1)
SODIUM: 140 meq/L (ref 135–145)
TOTAL PROTEIN: 7.3 g/dL (ref 6.0–8.3)
Total Bilirubin: 0.4 mg/dL (ref 0.2–1.2)

## 2017-06-13 LAB — CBC WITH DIFFERENTIAL/PLATELET
BASOS PCT: 0.5 % (ref 0.0–3.0)
Basophils Absolute: 0 10*3/uL (ref 0.0–0.1)
EOS ABS: 0.1 10*3/uL (ref 0.0–0.7)
EOS PCT: 2.2 % (ref 0.0–5.0)
HCT: 44.5 % (ref 39.0–52.0)
Hemoglobin: 14.7 g/dL (ref 13.0–17.0)
LYMPHS ABS: 2 10*3/uL (ref 0.7–4.0)
Lymphocytes Relative: 42.6 % (ref 12.0–46.0)
MCHC: 33.1 g/dL (ref 30.0–36.0)
MCV: 88.3 fl (ref 78.0–100.0)
MONO ABS: 0.6 10*3/uL (ref 0.1–1.0)
Monocytes Relative: 12.4 % — ABNORMAL HIGH (ref 3.0–12.0)
NEUTROS PCT: 42.3 % — AB (ref 43.0–77.0)
Neutro Abs: 1.9 10*3/uL (ref 1.4–7.7)
Platelets: 197 10*3/uL (ref 150.0–400.0)
RBC: 5.04 Mil/uL (ref 4.22–5.81)
RDW: 14.8 % (ref 11.5–15.5)
WBC: 4.6 10*3/uL (ref 4.0–10.5)

## 2017-06-13 LAB — TSH: TSH: 1.21 u[IU]/mL (ref 0.35–4.50)

## 2017-06-13 LAB — HEMOGLOBIN A1C: HEMOGLOBIN A1C: 6.2 % (ref 4.6–6.5)

## 2017-06-14 ENCOUNTER — Encounter: Payer: Self-pay | Admitting: Internal Medicine

## 2017-06-14 LAB — PSA, TOTAL AND FREE
PSA, % Free: 33 % (calc) (ref >25)
PSA, Free: 0.2 ng/mL
PSA, Total: 0.6 ng/mL (ref <=4.0)

## 2017-06-16 ENCOUNTER — Encounter: Payer: Self-pay | Admitting: Internal Medicine

## 2017-06-16 ENCOUNTER — Ambulatory Visit (INDEPENDENT_AMBULATORY_CARE_PROVIDER_SITE_OTHER): Payer: 59 | Admitting: Internal Medicine

## 2017-06-16 DIAGNOSIS — Z0000 Encounter for general adult medical examination without abnormal findings: Secondary | ICD-10-CM | POA: Diagnosis not present

## 2017-06-16 DIAGNOSIS — R739 Hyperglycemia, unspecified: Secondary | ICD-10-CM | POA: Diagnosis not present

## 2017-06-16 DIAGNOSIS — N189 Chronic kidney disease, unspecified: Secondary | ICD-10-CM | POA: Diagnosis not present

## 2017-06-16 DIAGNOSIS — I10 Essential (primary) hypertension: Secondary | ICD-10-CM | POA: Diagnosis not present

## 2017-06-16 MED ORDER — LOSARTAN POTASSIUM 50 MG PO TABS: 50.0000 mg | ORAL_TABLET | Freq: Every day | ORAL | 3 refills | Status: DC

## 2017-06-16 MED ORDER — VITAMIN D 1000 UNITS PO TABS: 1000.0000 [IU] | ORAL_TABLET | Freq: Every day | ORAL | 11 refills | Status: DC

## 2017-06-16 NOTE — Assessment & Plan Note (Signed)
Labs discussed  

## 2017-06-16 NOTE — Assessment & Plan Note (Signed)
Renal US Losartan 25 mg/d trial

## 2017-06-16 NOTE — Assessment & Plan Note (Signed)
BP Readings from Last 3 Encounters:  06/16/17 (!) 146/82  05/08/17 134/84  02/24/17 (!) 150/90

## 2017-06-16 NOTE — Progress Notes (Signed)
Subjective:  Patient ID: Hector Lee, male    DOB: 1971-01-18  Age: 46 y.o. MRN: [id number]  CC: No chief complaint on file.   HPI Hector Lee presents for elevated BP, elevated glucose f/u  Outpatient Medications Prior to Visit  Medication Sig Dispense Refill  . LUTEIN PO Take by mouth daily.    . Multiple Vitamin (MULTIVITAMIN) tablet Take 1 tablet by mouth daily.    Marland Kitchen triamcinolone ointment (KENALOG) 0.5 % Apply 1 application topically 2 (two) times daily. Do not use for more than 2 weeks 30 g 0   No facility-administered medications prior to visit.     ROS Review of Systems  Constitutional: Negative for appetite change, fatigue and unexpected weight change.  HENT: Negative for congestion, nosebleeds, sneezing, sore throat and trouble swallowing.   Eyes: Negative for itching and visual disturbance.  Respiratory: Negative for cough.   Cardiovascular: Negative for chest pain, palpitations and leg swelling.  Gastrointestinal: Negative for abdominal distention, blood in stool, diarrhea and nausea.  Genitourinary: Negative for frequency and hematuria.  Musculoskeletal: Negative for back pain, gait problem, joint swelling and neck pain.  Skin: Negative for rash.  Neurological: Negative for dizziness, tremors, speech difficulty and weakness.  Psychiatric/Behavioral: Negative for agitation, dysphoric mood and sleep disturbance. The patient is not nervous/anxious.     Objective:  BP (!) 146/82 (BP Location: Left Arm, Patient Position: Sitting, Cuff Size: Large)   Pulse 76   Temp 98.5 F (36.9 C) (Oral)   Resp 16   Ht  (1.905 m)   Wt (!) 301 lb 12.8 oz (136.9 kg)   SpO2 98%   BMI 37.72 kg/m   BP Readings from Last 3 Encounters:  06/16/17 (!) 146/82  05/08/17 134/84  02/24/17 (!) 150/90    Wt Readings from Last 3 Encounters:  06/16/17 (!) 301 lb 12.8 oz (136.9 kg)  05/08/17 (!) 306 lb (138.8 kg)  02/24/17 (!) 306 lb (138.8 kg)    Physical Exam    Constitutional: He is oriented to person, place, and time. He appears well-developed and well-nourished. No distress.  HENT:  Head: Normocephalic and atraumatic.  Right Ear: External ear normal.  Left Ear: External ear normal.  Nose: Nose normal.  Mouth/Throat: Oropharynx is clear and moist. No oropharyngeal exudate.  Eyes: Pupils are equal, round, and reactive to light. Conjunctivae and EOM are normal. Right eye exhibits no discharge. Left eye exhibits no discharge. No scleral icterus.  Neck: Normal range of motion. Neck supple. No JVD present. No tracheal deviation present. No thyromegaly present.  Cardiovascular: Normal rate, regular rhythm, normal heart sounds and intact distal pulses.  Exam reveals no gallop and no friction rub.   No murmur heard. Pulmonary/Chest: Effort normal and breath sounds normal. No stridor. No respiratory distress. He has no wheezes. He has no rales. He exhibits no tenderness.  Abdominal: Soft. Bowel sounds are normal. He exhibits no distension and no mass. There is no tenderness. There is no rebound and no guarding.  Genitourinary: Rectum normal, prostate normal and penis normal. Rectal exam shows guaiac negative stool. No penile tenderness.  Musculoskeletal: Normal range of motion. He exhibits no edema or tenderness.  Lymphadenopathy:    He has no cervical adenopathy.  Neurological: He is alert and oriented to person, place, and time. He has normal reflexes. No cranial nerve deficit. He exhibits normal muscle tone. Coordination normal.  Skin: Skin is warm and dry. No rash noted. He is not diaphoretic. No  erythema. No pallor.  Psychiatric: He has a normal mood and affect. His behavior is normal. Judgment and thought content normal.    Lab Results  Component Value Date   WBC 4.6 06/13/2017   HGB 14.7 06/13/2017   HCT 44.5 06/13/2017   PLT 197.0 06/13/2017   GLUCOSE 103 (H) 06/13/2017   CHOL 213 (H) 06/13/2017   TRIG 87.0 06/13/2017   HDL 38.00 (L)  06/13/2017   LDLDIRECT 187.4 04/09/2013   LDLCALC 158 (H) 06/13/2017   ALT 25 06/13/2017   AST 34 06/13/2017   NA 140 06/13/2017   K 4.5 06/13/2017   CL 103 06/13/2017   CREATININE 1.55 (H) 06/13/2017   BUN 16 06/13/2017   CO2 30 06/13/2017   TSH 1.21 06/13/2017   HGBA1C 6.2 06/13/2017    Ct Orbits W/cm  Result Date: 02/14/2016 CLINICAL DATA:  Right postseptal cellulitis readmitted yesterday. The patient has been a antibiotics. He states the swelling is unchanged but his right-sided vision his blurry ear. Postseptal cellulitis secondary to acute right frontal and ethmoid sinusitis with purulent drainage. Decreased right sided extraocular motion. Surgical drainage gland. EXAM: CT ORBITS WITH CONTRAST TECHNIQUE: Multidetector CT imaging of the orbits was performed following the bolus administration of intravenous contrast. CONTRAST:  1 ISOVUE-300 IOPAMIDOL (ISOVUE-300) INJECTION 61% COMPARISON:  CT orbits with contrast yesterday. FINDINGS: Postseptal right orbital cellulitis as extension from the acute right frontal and maxillary sinusitis is again noted. The extra conal abscess evident on yesterday's scan has increased in size, now measuring 6 x 16 x 18 mm. This compares with 4 x 14 x 13 mm yesterday. There is increased mass effect on the right orbit. Extra conal soft tissue inflammatory changes are noted superiorly as well extending to the lateral wall of the orbit. This is slightly more prominent than on yesterday's exam . Intraorbital mass effect is increased with progressive exophthalmos. There is no inflammatory change associated with the superior rectus muscle which was less evident on yesterday's exam. Perforation of the medial orbital wall by the right ethmoid and frontal sinus is again noted. There is diffuse opacification of the right frontal and anterior ethmoid air cells. 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 This then results in secondary obstruction of the residual anterior ethmoid and frontal sinuses. Mild diffuse mucosal thickening is again seen within the maxillary sinuses bilaterally. Prominent polyps or mucous retention cysts are noted anteriorly and inferiorly in the left frontal sinuses maxillary sinus. There is mild mucosal thickening throughout the left ethmoid air cells and inferior left frontal sinus. Mild mucosal thickening is also noted within the anterior left sphenoid sinus. Subtle low density along the anterior inferior right frontal lobe may be artifact. Early encephalitis cannot be excluded on the basis of CT. MRI brain with contrast is recommended for further evaluation. IMPRESSION: 1. Progressive postseptal orbital cellulitis associated with acute right frontal and ethmoid sinusitis. 2. Increasing extraconal postseptal abscess with mass effect on the right orbit. The abscess now measures 6 x 16 x 18 mm. 3. Increased diffuse extraconal inflammatory changes over the superior aspect of the right orbit. 4. Increasing exophthalmos. 5. Progressive inflammatory changes associated with the right superior rectus muscle suggesting early intraconal inflammatory changes well. 6. 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 7. Subtle low density along the anterior  inferior aspect the right frontal lobe raises the possibility of intracranial inflammatory change is well. Recommend MRI of the brain for further evaluation as this may be artifactual on CT. These results were called by telephone at the time of interpretation on 02/14/2016 at 10:18 am to Dr. Newman Pies , who verbally acknowledged these results. Electronically Signed   By: Marin Roberts M.D.   On: 02/14/2016 10:24   Ct Orbits W/cm  Result Date: 02/13/2016 CLINICAL DATA:  Right periorbital swelling.  Nasal and sinus congestion. EXAM: CT ORBITS WITH CONTRAST TECHNIQUE: Multidetector CT imaging of the orbits was performed following the bolus administration of intravenous contrast. CONTRAST:  75 mL Isovue-300 COMPARISON:  None. FINDINGS: The right frontal sinus and anterior right ethmoid air cells are completely opacified. There is osseous dehiscence involving the inferolateral wall of the right frontal sinus and lamina papyracea. Soft tissue and stranding extends from the sinuses into the superior and medial extraconal space of the right orbit. No organized abscess is identified. There is mild right proptosis with mild stretching of the right optic nerve. There is also mild-to-moderate right preseptal periorbital soft tissue swelling. Moderate, partially polypoid left maxillary sinus mucosal thickening is partially visualized. There is also mild left frontal, left ethmoid, and bilateral sphenoid sinus mucosal thickening. No sinus air-fluid levels are present. There is at most a trace left mastoid effusion. The visualized right mastoid air cells are clear. The visualized portion of the brain is unremarkable. No epidural collection is identified adjacent to the right-sided sinus disease. IMPRESSION: Right orbital postseptal cellulitis from extension of right frontal and right ethmoid sinus disease. No orbital abscess identified. These results were called by telephone at the time of interpretation on 02/13/2016 at 9:17 am to Apolonio Schneiders, who verbally acknowledged these results. Electronically Signed   By: Sebastian Ache M.D.   On: 02/13/2016 09:20    Assessment & Plan:   Diagnoses and all orders for this visit:  Essential hypertension  Hyperglycemia   I am having Mr. Sheller maintain his multivitamin, LUTEIN PO, and triamcinolone ointment.  No orders of the defined types were placed in this encounter.    Follow-up: No Follow-up on file.  Sonda Primes, MD

## 2017-06-19 ENCOUNTER — Encounter: Payer: Self-pay | Admitting: Internal Medicine

## 2017-06-26 NOTE — Addendum Note (Signed)
Addended by: Radford Pax M on: 06/26/2017 10:53 AM   Modules accepted: Orders

## 2017-07-05 ENCOUNTER — Encounter: Payer: Self-pay | Admitting: Internal Medicine

## 2017-07-13 DIAGNOSIS — H3411 Central retinal artery occlusion, right eye: Secondary | ICD-10-CM | POA: Diagnosis not present

## 2017-07-13 DIAGNOSIS — H2512 Age-related nuclear cataract, left eye: Secondary | ICD-10-CM | POA: Diagnosis not present

## 2017-07-13 DIAGNOSIS — H40012 Open angle with borderline findings, low risk, left eye: Secondary | ICD-10-CM | POA: Diagnosis not present

## 2017-07-28 ENCOUNTER — Ambulatory Visit
Admission: RE | Admit: 2017-07-28 | Discharge: 2017-07-28 | Disposition: A | Discharge status: Home / Self Care | Payer: 59 | Source: Ambulatory Visit | Attending: Internal Medicine | Admitting: Internal Medicine

## 2017-07-28 DIAGNOSIS — N189 Chronic kidney disease, unspecified: Secondary | ICD-10-CM

## 2017-08-07 DIAGNOSIS — R0789 Other chest pain: Secondary | ICD-10-CM | POA: Diagnosis not present

## 2018-01-15 DIAGNOSIS — H3411 Central retinal artery occlusion, right eye: Secondary | ICD-10-CM | POA: Diagnosis not present

## 2018-01-15 DIAGNOSIS — H2512 Age-related nuclear cataract, left eye: Secondary | ICD-10-CM | POA: Diagnosis not present

## 2018-01-15 DIAGNOSIS — H40012 Open angle with borderline findings, low risk, left eye: Secondary | ICD-10-CM | POA: Diagnosis not present

## 2018-03-18 ENCOUNTER — Other Ambulatory Visit: Payer: Self-pay

## 2018-03-18 ENCOUNTER — Encounter (HOSPITAL_COMMUNITY): Payer: Self-pay | Admitting: *Deleted

## 2018-03-18 ENCOUNTER — Emergency Department (HOSPITAL_COMMUNITY): Payer: 59

## 2018-03-18 ENCOUNTER — Emergency Department (HOSPITAL_COMMUNITY)
Admission: EM | Admit: 2018-03-18 | Discharge: 2018-03-18 | Disposition: A | Discharge status: Home / Self Care | Payer: 59 | Attending: Emergency Medicine | Admitting: Emergency Medicine

## 2018-03-18 DIAGNOSIS — S3991XA Unspecified injury of abdomen, initial encounter: Secondary | ICD-10-CM | POA: Diagnosis not present

## 2018-03-18 DIAGNOSIS — W2210XA Striking against or struck by unspecified automobile airbag, initial encounter: Secondary | ICD-10-CM | POA: Insufficient documentation

## 2018-03-18 DIAGNOSIS — S0181XA Laceration without foreign body of other part of head, initial encounter: Secondary | ICD-10-CM | POA: Insufficient documentation

## 2018-03-18 DIAGNOSIS — Z79899 Other long term (current) drug therapy: Secondary | ICD-10-CM | POA: Diagnosis not present

## 2018-03-18 DIAGNOSIS — S299XXA Unspecified injury of thorax, initial encounter: Secondary | ICD-10-CM | POA: Diagnosis not present

## 2018-03-18 DIAGNOSIS — M542 Cervicalgia: Secondary | ICD-10-CM | POA: Diagnosis not present

## 2018-03-18 DIAGNOSIS — Y998 Other external cause status: Secondary | ICD-10-CM | POA: Diagnosis not present

## 2018-03-18 DIAGNOSIS — S20219A Contusion of unspecified front wall of thorax, initial encounter: Secondary | ICD-10-CM | POA: Diagnosis not present

## 2018-03-18 DIAGNOSIS — S0083XA Contusion of other part of head, initial encounter: Secondary | ICD-10-CM | POA: Diagnosis not present

## 2018-03-18 DIAGNOSIS — Y92410 Unspecified street and highway as the place of occurrence of the external cause: Secondary | ICD-10-CM | POA: Insufficient documentation

## 2018-03-18 DIAGNOSIS — S199XXA Unspecified injury of neck, initial encounter: Secondary | ICD-10-CM | POA: Diagnosis present

## 2018-03-18 DIAGNOSIS — S161XXA Strain of muscle, fascia and tendon at neck level, initial encounter: Secondary | ICD-10-CM | POA: Diagnosis not present

## 2018-03-18 DIAGNOSIS — S0990XA Unspecified injury of head, initial encounter: Secondary | ICD-10-CM | POA: Diagnosis not present

## 2018-03-18 DIAGNOSIS — Y9389 Activity, other specified: Secondary | ICD-10-CM | POA: Insufficient documentation

## 2018-03-18 DIAGNOSIS — R079 Chest pain, unspecified: Secondary | ICD-10-CM | POA: Diagnosis not present

## 2018-03-18 DIAGNOSIS — S0993XA Unspecified injury of face, initial encounter: Secondary | ICD-10-CM | POA: Diagnosis not present

## 2018-03-18 LAB — CBC
HCT: 45.8 % (ref 39.0–52.0)
Hemoglobin: 15.1 g/dL (ref 13.0–17.0)
MCH: 29 pg (ref 26.0–34.0)
MCHC: 33 g/dL (ref 30.0–36.0)
MCV: 88.1 fL (ref 78.0–100.0)
Platelets: 208 10*3/uL (ref 150–400)
RBC: 5.2 MIL/uL (ref 4.22–5.81)
RDW: 15 % (ref 11.5–15.5)
WBC: 9.3 10*3/uL (ref 4.0–10.5)

## 2018-03-18 LAB — COMPREHENSIVE METABOLIC PANEL
ALT: 38 U/L (ref 0–44)
AST: 50 U/L — AB (ref 15–41)
Albumin: 4.7 g/dL (ref 3.5–5.0)
Alkaline Phosphatase: 76 U/L (ref 38–126)
Anion gap: 9 (ref 5–15)
BILIRUBIN TOTAL: 0.8 mg/dL (ref 0.3–1.2)
BUN: 21 mg/dL — AB (ref 6–20)
CO2: 26 mmol/L (ref 22–32)
Calcium: 9.9 mg/dL (ref 8.9–10.3)
Chloride: 103 mmol/L (ref 98–111)
Creatinine, Ser: 1.48 mg/dL — ABNORMAL HIGH (ref 0.61–1.24)
GFR calc Af Amer: >60 mL/min (ref >60)
GFR, EST NON AFRICAN AMERICAN: 55 mL/min — AB (ref >60)
Glucose, Bld: 109 mg/dL — ABNORMAL HIGH (ref 70–99)
Potassium: 4.3 mmol/L (ref 3.5–5.1)
Sodium: 138 mmol/L (ref 135–145)
Total Protein: 8.5 g/dL — ABNORMAL HIGH (ref 6.5–8.1)

## 2018-03-18 LAB — URINALYSIS, ROUTINE W REFLEX MICROSCOPIC
Bilirubin Urine: NEGATIVE
GLUCOSE, UA: NEGATIVE mg/dL
HGB URINE DIPSTICK: NEGATIVE
Ketones, ur: 5 mg/dL — AB
Leukocytes, UA: NEGATIVE
Nitrite: NEGATIVE
PROTEIN: NEGATIVE mg/dL
Specific Gravity, Urine: 1.027 (ref 1.005–1.030)
pH: 5 (ref 5.0–8.0)

## 2018-03-18 MED ORDER — IOPAMIDOL (ISOVUE-300) INJECTION 61%
100.0000 mL | Freq: Once | INTRAVENOUS | Status: AC | PRN
  Administered 2018-03-18: 100 mL via INTRAVENOUS

## 2018-03-18 MED ORDER — SODIUM CHLORIDE 0.9 % IV BOLUS
1000.0000 mL | Freq: Once | INTRAVENOUS | Status: AC
  Administered 2018-03-18: 1000 mL via INTRAVENOUS

## 2018-03-18 MED ORDER — FENTANYL CITRATE (PF) 100 MCG/2ML IJ SOLN
50.0000 ug | Freq: Once | INTRAMUSCULAR | Status: AC
  Administered 2018-03-18: 50 ug via INTRAVENOUS
  Filled 2018-03-18: qty 2

## 2018-03-18 MED ORDER — IOPAMIDOL (ISOVUE-300) INJECTION 61%
INTRAVENOUS | Status: AC
  Filled 2018-03-18: qty 100

## 2018-03-18 MED ORDER — METHOCARBAMOL 500 MG PO TABS: 500.0000 mg | ORAL_TABLET | Freq: Two times a day (BID) | ORAL | 0 refills | Status: AC

## 2018-03-18 MED ORDER — SODIUM CHLORIDE 0.9 % IV SOLN: INTRAVENOUS | Status: DC

## 2018-03-18 MED ORDER — NAPROXEN 500 MG PO TABS: 500.0000 mg | ORAL_TABLET | Freq: Two times a day (BID) | ORAL | 0 refills | Status: AC
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
## 2018-03-18 NOTE — ED Notes (Signed)
Patient transported to CT 

## 2018-03-18 NOTE — ED Notes (Signed)
Pt ambulatory in hall without assistance
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
## 2018-03-18 NOTE — ED Notes (Signed)
Bed: WA17 Expected date:  Expected time:  Means of arrival:  Comments: Triage 1  

## 2018-03-18 NOTE — ED Notes (Signed)
Patient verbalizes d/c instructions, no further questions. Unable to E-sign to do machine not working.

## 2018-03-18 NOTE — Discharge Instructions (Signed)
Naprosyn for pain and inflammation as prescribed.  Robaxin for muscle spasms.  Try ice packs.  Rest, drink plenty of fluids.  Follow-up with your doctor next week as needed.  Return if any worsening symptoms.
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
## 2018-03-21 ENCOUNTER — Encounter: Payer: Self-pay | Admitting: Internal Medicine

## 2018-03-30 ENCOUNTER — Ambulatory Visit (INDEPENDENT_AMBULATORY_CARE_PROVIDER_SITE_OTHER): Payer: 59 | Admitting: Internal Medicine

## 2018-03-30 ENCOUNTER — Ambulatory Visit (INDEPENDENT_AMBULATORY_CARE_PROVIDER_SITE_OTHER)
Admission: RE | Admit: 2018-03-30 | Discharge: 2018-03-30 | Disposition: A | Discharge status: Home / Self Care | Payer: 59 | Source: Ambulatory Visit | Attending: Internal Medicine | Admitting: Internal Medicine

## 2018-03-30 ENCOUNTER — Encounter: Payer: Self-pay | Admitting: Internal Medicine

## 2018-03-30 DIAGNOSIS — S8011XA Contusion of right lower leg, initial encounter: Secondary | ICD-10-CM

## 2018-03-30 DIAGNOSIS — I10 Essential (primary) hypertension: Secondary | ICD-10-CM | POA: Diagnosis not present

## 2018-03-30 DIAGNOSIS — S60221A Contusion of right hand, initial encounter: Secondary | ICD-10-CM | POA: Diagnosis not present

## 2018-03-30 DIAGNOSIS — M79641 Pain in right hand: Secondary | ICD-10-CM | POA: Diagnosis not present

## 2018-03-30 DIAGNOSIS — N189 Chronic kidney disease, unspecified: Secondary | ICD-10-CM | POA: Diagnosis not present

## 2018-03-30 DIAGNOSIS — S60229A Contusion of unspecified hand, initial encounter: Secondary | ICD-10-CM | POA: Insufficient documentation

## 2018-03-30 DIAGNOSIS — S161XXA Strain of muscle, fascia and tendon at neck level, initial encounter: Secondary | ICD-10-CM

## 2018-03-30 DIAGNOSIS — S6991XA Unspecified injury of right wrist, hand and finger(s), initial encounter: Secondary | ICD-10-CM | POA: Diagnosis not present

## 2018-03-30 DIAGNOSIS — S8010XA Contusion of unspecified lower leg, initial encounter: Secondary | ICD-10-CM | POA: Insufficient documentation
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
## 2018-03-30 NOTE — Assessment & Plan Note (Signed)
NAS diet 

## 2018-03-30 NOTE — Assessment & Plan Note (Signed)
X ray Start PT
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
## 2018-03-30 NOTE — Assessment & Plan Note (Signed)
Monitoring BMET Avoid NSAIDs

## 2018-03-30 NOTE — Assessment & Plan Note (Signed)
PT

## 2018-03-30 NOTE — Assessment & Plan Note (Signed)
CT ok Start PT Tylenol prn

## 2018-03-30 NOTE — Patient Instructions (Signed)
Tylenol 650 mg 4 times a day as needed for pain

## 2018-04-12 ENCOUNTER — Telehealth: Payer: Self-pay | Admitting: Internal Medicine

## 2018-04-12 NOTE — Telephone Encounter (Signed)
Faxed referral to fax given below

## 2018-04-12 NOTE — Telephone Encounter (Signed)
Copied from CRM [id number]. Topic: Referral - Status >> Apr 12, 2018  9:36 AM Oneal GroutSebastian, Jennifer S wrote: Reason for CRM: Checking status of [facility] referral, please refax to [phone], they have not received anything

## 2018-04-17 DIAGNOSIS — M542 Cervicalgia: Secondary | ICD-10-CM | POA: Diagnosis not present

## 2018-04-17 DIAGNOSIS — R51 Headache: Secondary | ICD-10-CM | POA: Diagnosis not present

## 2018-04-17 DIAGNOSIS — S60221D Contusion of right hand, subsequent encounter: Secondary | ICD-10-CM | POA: Diagnosis not present

## 2018-04-23 DIAGNOSIS — S60221D Contusion of right hand, subsequent encounter: Secondary | ICD-10-CM | POA: Diagnosis not present

## 2018-04-23 DIAGNOSIS — M542 Cervicalgia: Secondary | ICD-10-CM | POA: Diagnosis not present

## 2018-04-23 DIAGNOSIS — R51 Headache: Secondary | ICD-10-CM | POA: Diagnosis not present

## 2018-04-24 DIAGNOSIS — M542 Cervicalgia: Secondary | ICD-10-CM | POA: Diagnosis not present

## 2018-04-24 DIAGNOSIS — S60221D Contusion of right hand, subsequent encounter: Secondary | ICD-10-CM | POA: Diagnosis not present

## 2018-04-24 DIAGNOSIS — R51 Headache: Secondary | ICD-10-CM | POA: Diagnosis not present

## 2018-04-26 DIAGNOSIS — M542 Cervicalgia: Secondary | ICD-10-CM | POA: Diagnosis not present

## 2018-04-26 DIAGNOSIS — R51 Headache: Secondary | ICD-10-CM | POA: Diagnosis not present

## 2018-04-26 DIAGNOSIS — S60221D Contusion of right hand, subsequent encounter: Secondary | ICD-10-CM | POA: Diagnosis not present

## 2018-04-30 DIAGNOSIS — R51 Headache: Secondary | ICD-10-CM | POA: Diagnosis not present

## 2018-04-30 DIAGNOSIS — S60221D Contusion of right hand, subsequent encounter: Secondary | ICD-10-CM | POA: Diagnosis not present

## 2018-04-30 DIAGNOSIS — M542 Cervicalgia: Secondary | ICD-10-CM | POA: Diagnosis not present

## 2018-05-01 DIAGNOSIS — M542 Cervicalgia: Secondary | ICD-10-CM | POA: Diagnosis not present

## 2018-05-01 DIAGNOSIS — R51 Headache: Secondary | ICD-10-CM | POA: Diagnosis not present

## 2018-05-01 DIAGNOSIS — S60221D Contusion of right hand, subsequent encounter: Secondary | ICD-10-CM | POA: Diagnosis not present

## 2018-05-08 DIAGNOSIS — S60221D Contusion of right hand, subsequent encounter: Secondary | ICD-10-CM | POA: Diagnosis not present

## 2018-05-08 DIAGNOSIS — R51 Headache: Secondary | ICD-10-CM | POA: Diagnosis not present

## 2018-05-08 DIAGNOSIS — M542 Cervicalgia: Secondary | ICD-10-CM | POA: Diagnosis not present

## 2018-05-10 DIAGNOSIS — M542 Cervicalgia: Secondary | ICD-10-CM | POA: Diagnosis not present

## 2018-05-10 DIAGNOSIS — S60221D Contusion of right hand, subsequent encounter: Secondary | ICD-10-CM | POA: Diagnosis not present

## 2018-05-10 DIAGNOSIS — R51 Headache: Secondary | ICD-10-CM | POA: Diagnosis not present

## 2018-05-15 ENCOUNTER — Encounter: Payer: Self-pay | Admitting: Internal Medicine

## 2018-05-15 ENCOUNTER — Ambulatory Visit (INDEPENDENT_AMBULATORY_CARE_PROVIDER_SITE_OTHER): Payer: 59 | Admitting: Internal Medicine

## 2018-05-15 VITALS — BP 136/88 | HR 83 | Ht 76.0 in | Wt 316.0 lb

## 2018-05-15 DIAGNOSIS — Z0000 Encounter for general adult medical examination without abnormal findings: Secondary | ICD-10-CM

## 2018-05-15 DIAGNOSIS — Z23 Encounter for immunization: Secondary | ICD-10-CM | POA: Diagnosis not present

## 2018-05-15 DIAGNOSIS — S60221D Contusion of right hand, subsequent encounter: Secondary | ICD-10-CM

## 2018-05-15 DIAGNOSIS — N189 Chronic kidney disease, unspecified: Secondary | ICD-10-CM

## 2018-05-15 DIAGNOSIS — S8011XD Contusion of right lower leg, subsequent encounter: Secondary | ICD-10-CM

## 2018-05-15 DIAGNOSIS — S161XXA Strain of muscle, fascia and tendon at neck level, initial encounter: Secondary | ICD-10-CM

## 2018-05-15 NOTE — Assessment & Plan Note (Signed)
BMET 
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
## 2018-05-15 NOTE — Assessment & Plan Note (Signed)
Start hand PT

## 2018-05-15 NOTE — Assessment & Plan Note (Signed)
Better  

## 2018-05-15 NOTE — Assessment & Plan Note (Signed)
Cont w/PT 

## 2018-05-16 ENCOUNTER — Ambulatory Visit: Payer: 59 | Admitting: Internal Medicine

## 2018-05-17 DIAGNOSIS — R51 Headache: Secondary | ICD-10-CM | POA: Diagnosis not present

## 2018-05-17 DIAGNOSIS — M542 Cervicalgia: Secondary | ICD-10-CM | POA: Diagnosis not present

## 2018-05-17 DIAGNOSIS — S60221D Contusion of right hand, subsequent encounter: Secondary | ICD-10-CM | POA: Diagnosis not present

## 2018-05-22 DIAGNOSIS — R51 Headache: Secondary | ICD-10-CM | POA: Diagnosis not present

## 2018-05-22 DIAGNOSIS — M542 Cervicalgia: Secondary | ICD-10-CM | POA: Diagnosis not present

## 2018-05-22 DIAGNOSIS — S60221D Contusion of right hand, subsequent encounter: Secondary | ICD-10-CM | POA: Diagnosis not present

## 2018-05-29 DIAGNOSIS — M542 Cervicalgia: Secondary | ICD-10-CM | POA: Diagnosis not present

## 2018-05-29 DIAGNOSIS — R51 Headache: Secondary | ICD-10-CM | POA: Diagnosis not present

## 2018-05-29 DIAGNOSIS — S60221D Contusion of right hand, subsequent encounter: Secondary | ICD-10-CM | POA: Diagnosis not present

## 2018-05-31 DIAGNOSIS — S60221D Contusion of right hand, subsequent encounter: Secondary | ICD-10-CM | POA: Diagnosis not present

## 2018-05-31 DIAGNOSIS — R51 Headache: Secondary | ICD-10-CM | POA: Diagnosis not present

## 2018-05-31 DIAGNOSIS — M542 Cervicalgia: Secondary | ICD-10-CM | POA: Diagnosis not present

## 2018-06-12 DIAGNOSIS — R51 Headache: Secondary | ICD-10-CM | POA: Diagnosis not present

## 2018-06-12 DIAGNOSIS — S60221D Contusion of right hand, subsequent encounter: Secondary | ICD-10-CM | POA: Diagnosis not present

## 2018-06-12 DIAGNOSIS — M542 Cervicalgia: Secondary | ICD-10-CM | POA: Diagnosis not present

## 2018-06-19 DIAGNOSIS — S60221D Contusion of right hand, subsequent encounter: Secondary | ICD-10-CM | POA: Diagnosis not present

## 2018-06-19 DIAGNOSIS — R51 Headache: Secondary | ICD-10-CM | POA: Diagnosis not present

## 2018-06-19 DIAGNOSIS — M542 Cervicalgia: Secondary | ICD-10-CM | POA: Diagnosis not present

## 2018-06-26 DIAGNOSIS — R51 Headache: Secondary | ICD-10-CM | POA: Diagnosis not present

## 2018-06-26 DIAGNOSIS — S60221D Contusion of right hand, subsequent encounter: Secondary | ICD-10-CM | POA: Diagnosis not present

## 2018-06-26 DIAGNOSIS — M542 Cervicalgia: Secondary | ICD-10-CM | POA: Diagnosis not present

## 2018-07-05 DIAGNOSIS — M542 Cervicalgia: Secondary | ICD-10-CM | POA: Diagnosis not present

## 2018-07-05 DIAGNOSIS — S60221D Contusion of right hand, subsequent encounter: Secondary | ICD-10-CM | POA: Diagnosis not present

## 2018-07-05 DIAGNOSIS — R51 Headache: Secondary | ICD-10-CM | POA: Diagnosis not present

## 2018-07-17 ENCOUNTER — Ambulatory Visit: Payer: 59 | Admitting: Internal Medicine

## 2018-07-17 DIAGNOSIS — R51 Headache: Secondary | ICD-10-CM | POA: Diagnosis not present

## 2018-07-17 DIAGNOSIS — S60221D Contusion of right hand, subsequent encounter: Secondary | ICD-10-CM | POA: Diagnosis not present

## 2018-07-17 DIAGNOSIS — M542 Cervicalgia: Secondary | ICD-10-CM | POA: Diagnosis not present

## 2018-07-23 DIAGNOSIS — H2512 Age-related nuclear cataract, left eye: Secondary | ICD-10-CM | POA: Diagnosis not present

## 2018-07-23 DIAGNOSIS — H40012 Open angle with borderline findings, low risk, left eye: Secondary | ICD-10-CM | POA: Diagnosis not present

## 2018-07-23 DIAGNOSIS — H3411 Central retinal artery occlusion, right eye: Secondary | ICD-10-CM | POA: Diagnosis not present

## 2018-07-31 DIAGNOSIS — S60221D Contusion of right hand, subsequent encounter: Secondary | ICD-10-CM | POA: Diagnosis not present

## 2018-07-31 DIAGNOSIS — R51 Headache: Secondary | ICD-10-CM | POA: Diagnosis not present

## 2018-07-31 DIAGNOSIS — M542 Cervicalgia: Secondary | ICD-10-CM | POA: Diagnosis not present

## 2018-08-22 ENCOUNTER — Encounter: Payer: Self-pay | Admitting: Internal Medicine

## 2018-08-22 ENCOUNTER — Encounter

## 2018-08-22 ENCOUNTER — Ambulatory Visit (INDEPENDENT_AMBULATORY_CARE_PROVIDER_SITE_OTHER): Payer: 59 | Admitting: Internal Medicine

## 2018-08-22 VITALS — BP 138/84 | HR 72 | Temp 98.0°F | Ht 76.0 in | Wt 321.0 lb

## 2018-08-22 DIAGNOSIS — S161XXD Strain of muscle, fascia and tendon at neck level, subsequent encounter: Secondary | ICD-10-CM | POA: Diagnosis not present

## 2018-08-22 DIAGNOSIS — I10 Essential (primary) hypertension: Secondary | ICD-10-CM

## 2018-08-22 NOTE — Assessment & Plan Note (Signed)
NAS diet 

## 2018-08-22 NOTE — Assessment & Plan Note (Signed)
80% better Slow recovery Cont with PT RTC 3 mo
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
## 2018-09-08 NOTE — Assessment & Plan Note (Signed)
In PT 

## 2018-09-18 DIAGNOSIS — S60221D Contusion of right hand, subsequent encounter: Secondary | ICD-10-CM | POA: Diagnosis not present

## 2018-09-18 DIAGNOSIS — R51 Headache: Secondary | ICD-10-CM | POA: Diagnosis not present

## 2018-09-18 DIAGNOSIS — M542 Cervicalgia: Secondary | ICD-10-CM | POA: Diagnosis not present

## 2018-09-27 DIAGNOSIS — S60221D Contusion of right hand, subsequent encounter: Secondary | ICD-10-CM | POA: Diagnosis not present

## 2018-09-27 DIAGNOSIS — M542 Cervicalgia: Secondary | ICD-10-CM | POA: Diagnosis not present

## 2018-09-27 DIAGNOSIS — R51 Headache: Secondary | ICD-10-CM | POA: Diagnosis not present

## 2018-10-11 DIAGNOSIS — M542 Cervicalgia: Secondary | ICD-10-CM | POA: Diagnosis not present

## 2018-10-11 DIAGNOSIS — R51 Headache: Secondary | ICD-10-CM | POA: Diagnosis not present

## 2018-10-11 DIAGNOSIS — S60221D Contusion of right hand, subsequent encounter: Secondary | ICD-10-CM | POA: Diagnosis not present

## 2018-11-01 DIAGNOSIS — S60221D Contusion of right hand, subsequent encounter: Secondary | ICD-10-CM | POA: Diagnosis not present

## 2018-11-01 DIAGNOSIS — M542 Cervicalgia: Secondary | ICD-10-CM | POA: Diagnosis not present

## 2018-11-01 DIAGNOSIS — R51 Headache: Secondary | ICD-10-CM | POA: Diagnosis not present

## 2018-11-20 DIAGNOSIS — M542 Cervicalgia: Secondary | ICD-10-CM | POA: Diagnosis not present

## 2018-11-20 DIAGNOSIS — R51 Headache: Secondary | ICD-10-CM | POA: Diagnosis not present

## 2018-11-20 DIAGNOSIS — S60221D Contusion of right hand, subsequent encounter: Secondary | ICD-10-CM | POA: Diagnosis not present

## 2018-11-21 ENCOUNTER — Ambulatory Visit (INDEPENDENT_AMBULATORY_CARE_PROVIDER_SITE_OTHER): Payer: 59 | Admitting: Internal Medicine

## 2018-11-21 ENCOUNTER — Other Ambulatory Visit: Payer: Self-pay

## 2018-11-21 ENCOUNTER — Encounter: Payer: Self-pay | Admitting: Internal Medicine

## 2018-11-21 DIAGNOSIS — S161XXD Strain of muscle, fascia and tendon at neck level, subsequent encounter: Secondary | ICD-10-CM | POA: Diagnosis not present

## 2018-11-21 DIAGNOSIS — I10 Essential (primary) hypertension: Secondary | ICD-10-CM | POA: Diagnosis not present

## 2018-11-21 NOTE — Assessment & Plan Note (Signed)
BP Readings from Last 3 Encounters:  11/21/18 130/72  08/22/18 138/84  05/15/18 136/88

## 2018-11-21 NOTE — Assessment & Plan Note (Signed)
Neck pain is 75% better w/PT Cont w/PT through April

## 2018-11-21 NOTE — Assessment & Plan Note (Signed)
75% better w/PT Cont w/PT through April
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
## 2018-11-29 ENCOUNTER — Telehealth: Payer: Self-pay | Admitting: Internal Medicine

## 2018-11-29 DIAGNOSIS — S161XXA Strain of muscle, fascia and tendon at neck level, initial encounter: Secondary | ICD-10-CM

## 2018-11-29 DIAGNOSIS — S60221D Contusion of right hand, subsequent encounter: Secondary | ICD-10-CM

## 2018-11-29 NOTE — Telephone Encounter (Signed)
Copied from CRM [id number]. Topic: Quick Communication - See Telephone Encounter >> Nov 29, 2018  3:33 PM Fanny Bien wrote: CRM for notification. See Telephone encounter for: 11/29/18. Beth calling with PT and hand called and stated that she will need an order for PT faxed over. Fax#[phone]. Please advise

## 2018-11-30 NOTE — Telephone Encounter (Signed)
Referral faxed

## 2019-01-21 DIAGNOSIS — H2512 Age-related nuclear cataract, left eye: Secondary | ICD-10-CM | POA: Diagnosis not present

## 2019-01-21 DIAGNOSIS — H40012 Open angle with borderline findings, low risk, left eye: Secondary | ICD-10-CM | POA: Diagnosis not present

## 2019-01-21 DIAGNOSIS — H3411 Central retinal artery occlusion, right eye: Secondary | ICD-10-CM | POA: Diagnosis not present

## 2019-02-21 ENCOUNTER — Encounter: Payer: Self-pay | Admitting: Internal Medicine

## 2019-02-21 ENCOUNTER — Ambulatory Visit (INDEPENDENT_AMBULATORY_CARE_PROVIDER_SITE_OTHER): Payer: 59 | Admitting: Internal Medicine

## 2019-02-21 DIAGNOSIS — S60221D Contusion of right hand, subsequent encounter: Secondary | ICD-10-CM | POA: Diagnosis not present

## 2019-02-21 DIAGNOSIS — S161XXD Strain of muscle, fascia and tendon at neck level, subsequent encounter: Secondary | ICD-10-CM

## 2019-02-21 DIAGNOSIS — S8011XD Contusion of right lower leg, subsequent encounter: Secondary | ICD-10-CM | POA: Diagnosis not present

## 2019-02-21 NOTE — Assessment & Plan Note (Addendum)
Overall better. Has residual pain. Resume PT
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
## 2019-02-21 NOTE — Assessment & Plan Note (Signed)
Some residual pain remains

## 2019-02-21 NOTE — Assessment & Plan Note (Signed)
Much better 

## 2019-02-21 NOTE — Assessment & Plan Note (Signed)
PT was on hold due COVID

## 2019-03-05 ENCOUNTER — Telehealth: Payer: Self-pay | Admitting: Internal Medicine

## 2019-03-05 DIAGNOSIS — S161XXD Strain of muscle, fascia and tendon at neck level, subsequent encounter: Secondary | ICD-10-CM

## 2019-03-05 DIAGNOSIS — S60221D Contusion of right hand, subsequent encounter: Secondary | ICD-10-CM

## 2019-03-05 NOTE — Telephone Encounter (Signed)
Patient needs a new referral to [facility].  Patient call back [phone]  Referral INFO [facility] [address] [place] [address] Phone [phone]

## 2019-03-06 NOTE — Telephone Encounter (Signed)
Referral re-done.

## 2019-05-09 NOTE — Telephone Encounter (Signed)
Relation to pt: self  Call back number: [phone]    Reason for call:  Patient states Physical Therapy d/c today, patient scheduled a follow up with PCP next available is not until 05/30/2019, patient requesting an extension, please advise

## 2019-05-13 ENCOUNTER — Telehealth: Payer: Self-pay | Admitting: *Deleted

## 2019-05-13 DIAGNOSIS — S161XXD Strain of muscle, fascia and tendon at neck level, subsequent encounter: Secondary | ICD-10-CM

## 2019-05-13 NOTE — Telephone Encounter (Signed)
New referral placed. Pt informed via MyChart.  

## 2019-05-13 NOTE — Telephone Encounter (Signed)
Hi Dr. Alain Marion, I'm scheduled to see you on Sep 16. My last day for PT was today 8/27. I want to see if I could be extended until I'm able to see you on Sep 16. I believe you have to send on information based on attached letter

## 2019-05-30 ENCOUNTER — Encounter: Payer: Self-pay | Admitting: Internal Medicine

## 2019-05-30 ENCOUNTER — Ambulatory Visit (INDEPENDENT_AMBULATORY_CARE_PROVIDER_SITE_OTHER): Payer: 59 | Admitting: Internal Medicine

## 2019-05-30 ENCOUNTER — Ambulatory Visit (INDEPENDENT_AMBULATORY_CARE_PROVIDER_SITE_OTHER)
Admission: RE | Admit: 2019-05-30 | Discharge: 2019-05-30 | Disposition: A | Discharge status: Home / Self Care | Payer: 59 | Source: Ambulatory Visit | Attending: Internal Medicine | Admitting: Internal Medicine

## 2019-05-30 ENCOUNTER — Other Ambulatory Visit: Payer: Self-pay

## 2019-05-30 VITALS — BP 134/86 | HR 66 | Temp 98.2°F | Ht 76.0 in | Wt 321.0 lb

## 2019-05-30 DIAGNOSIS — M79671 Pain in right foot: Secondary | ICD-10-CM

## 2019-05-30 DIAGNOSIS — S60221D Contusion of right hand, subsequent encounter: Secondary | ICD-10-CM

## 2019-05-30 DIAGNOSIS — Z0000 Encounter for general adult medical examination without abnormal findings: Secondary | ICD-10-CM

## 2019-05-30 DIAGNOSIS — S161XXD Strain of muscle, fascia and tendon at neck level, subsequent encounter: Secondary | ICD-10-CM | POA: Diagnosis not present

## 2019-05-30 NOTE — Assessment & Plan Note (Signed)
R finger pain - in PT X ray

## 2019-05-30 NOTE — Assessment & Plan Note (Signed)
Neck pain is resolving In PT

## 2019-05-30 NOTE — Patient Instructions (Signed)
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
## 2019-05-30 NOTE — Assessment & Plan Note (Signed)
Diet discussed 
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
## 2019-05-30 NOTE — Assessment & Plan Note (Signed)
In PT 2/week

## 2019-06-02 ENCOUNTER — Encounter: Payer: Self-pay | Admitting: Internal Medicine

## 2019-06-03 ENCOUNTER — Other Ambulatory Visit: Payer: Self-pay | Admitting: Internal Medicine

## 2019-06-03 DIAGNOSIS — M79671 Pain in right foot: Secondary | ICD-10-CM

## 2019-07-04 ENCOUNTER — Ambulatory Visit (INDEPENDENT_AMBULATORY_CARE_PROVIDER_SITE_OTHER): Payer: 59 | Admitting: Family Medicine

## 2019-07-04 ENCOUNTER — Ambulatory Visit: Payer: Self-pay

## 2019-07-04 ENCOUNTER — Encounter: Payer: Self-pay | Admitting: Family Medicine

## 2019-07-04 ENCOUNTER — Other Ambulatory Visit: Payer: Self-pay

## 2019-07-04 VITALS — BP 130/86 | HR 75 | Ht 76.0 in | Wt 310.0 lb

## 2019-07-04 DIAGNOSIS — M2580 Other specified joint disorders, unspecified joint: Secondary | ICD-10-CM

## 2019-07-04 DIAGNOSIS — M79671 Pain in right foot: Secondary | ICD-10-CM

## 2019-07-04 NOTE — Patient Instructions (Signed)
Good to see you.   [place] Total Support  Avoid being barefoot  Ice 20 minutes 2 times daily. Usually after activity and before bed.  Exercises 3 times a week.   pennsaid pinkie amount topically 2 times daily as needed.   Vitamin D 12-4998 IU daily   See me again in 6 weeks

## 2019-07-04 NOTE — Assessment & Plan Note (Signed)
Sesamoiditis of the first toe on the right side.  Discussed over-the-counter orthotics, avoiding certain activities, discussed home exercise, vitamin D supplementation.  Follow-up again in 5 weeks to make sure patient is improving.  Could have some mild capsulitis and may need injection as well

## 2019-07-04 NOTE — Progress Notes (Signed)
[name] [place], [place] [address] Phone: [phone] Subjective:   Fontaine No, am serving as a scribe for Dr. Hulan Saas.  I'm seeing this patient by the request  of:  Plotnikov, Evie Lacks, MD   CC: Right foot pain  [id number]  Hector Lee is a 48 y.o. male coming in with complaint of right foot pain. Avid walker. Walks 3-4 miles, 3x a week. Noticed next day after walking that he felt like something was under his foot in the MTP joints. Patient has been biking instead without pain. If he is on his feet a lot he notices something underneath his foot. When walking he notices shooting pain especially with great toe extension and pushing off motion in walking. Pain increases with barefoot walking.   Foot exam did have a x-ray taken on May 31, 2019.  This was independently visualized by me showing no significant bony abnormality    Past Medical History:  Diagnosis Date  . Hyperhydrosis disorder    Past Surgical History:  Procedure Laterality Date  . NASAL SINUS SURGERY Right 02/14/2016   Procedure: ENDOSCOPIC RIGHT MEDIAL ORBITAL WALL DECOMPRESSION;  Surgeon: Leta Baptist, MD;  Location: [facility] OR;  Service: ENT;  Laterality: Right;  . NO PAST SURGERIES     Social History   Socioeconomic History  . Marital status: Single    Spouse name: Not on file  . Number of children: Not on file  . Years of education: Not on file  . Highest education level: Not on file  Occupational History  . Not on file  Social Needs  . Financial resource strain: Not on file  . Food insecurity    Worry: Not on file    Inability: Not on file  . Transportation needs    Medical: Not on file    Non-medical: Not on file  Tobacco Use  . Smoking status: Never Smoker  . Smokeless tobacco: Never Used  Substance and Sexual Activity  . Alcohol use: Yes  . Drug use: No  . Sexual activity: Yes  Lifestyle  . Physical activity    Days per week: Not  on file    Minutes per session: Not on file  . Stress: Not on file  Relationships  . Social Herbalist on phone: Not on file    Gets together: Not on file    Attends religious service: Not on file    Active member of club or organization: Not on file    Attends meetings of clubs or organizations: Not on file    Relationship status: Not on file  Other Topics Concern  . Not on file  Social History Narrative  . Not on file   Allergies  Allergen Reactions  . Shellfish Allergy Anaphylaxis   Family History  Problem Relation Age of Onset  . Diabetes Unknown        grandmother       Current Outpatient Medications (Analgesics):  .  naproxen (NAPROSYN) 500 MG tablet, Take 1 tablet (500 mg total) by mouth 2 (two) times daily.   Current Outpatient Medications (Other):  .  latanoprost (XALATAN) 0.005 % ophthalmic solution, Place 2 drops into the left eye at bedtime. .  methocarbamol (ROBAXIN) 500 MG tablet, Take 1 tablet (500 mg total) by mouth 2 (two) times daily. .  Multiple Vitamin (MULTIVITAMIN) tablet, Take 1 tablet by mouth daily. .  NON FORMULARY, Take 1 tablet by  mouth daily. Beet root supplement .  Omega-3 Fatty Acids (FISH OIL) 1000 MG CAPS, Take 1 capsule by mouth daily. .  timolol (TIMOPTIC) 0.5 % ophthalmic solution, Place 2 drops into the left eye daily.     Past medical history, social, surgical and family history all reviewed in electronic medical record.  No pertanent information unless stated regarding to the chief complaint.   Review of Systems:  No headache, visual changes, nausea, vomiting, diarrhea, constipation, dizziness, abdominal pain, skin rash, fevers, chills, night sweats, weight loss, swollen lymph nodes, body aches, joint swelling, , chest pain, shortness of breath, mood changes.  Positive muscle aches  Objective  Blood pressure 130/86, pulse 75, height 6\' 4"  (1.93 m), weight (!) 310 lb (140.6 kg), SpO2 98 %.    General: No apparent  distress alert and oriented x3 mood and affect normal, dressed appropriately.  HEENT: Pupils equal, extraocular movements intact  Respiratory: Patient's speak in full sentences and does not appear short of breath  Cardiovascular: No lower extremity edema, non tender, no erythema  Skin: Warm dry intact with no signs of infection or rash on extremities or on axial skeleton.  Abdomen: Soft nontender  Neuro: Cranial nerves II through XII are intact, neurovascularly intact in all extremities with 2+ DTRs and 2+ pulses.  Lymph: No lymphadenopathy of posterior or anterior cervical chain or axillae bilaterally.  Gait normal with good balance and coordination.  MSK:  Non tender with full range of motion and good stability and symmetric strength and tone of shoulders, elbows, wrist, hip, knee and ankles bilaterally.  Right foot exam shows the patient does have some pes planus with overpronation of the hindfoot.  Patient does have rigid limitus of the first toe.  Tenderness over the sesamoid bone negative squeeze test  Limited musculoskeletal ultrasound was performed and interpreted by  Limited ultrasound shows the patient does have possible cortical irregularity of the sesamoid bone in the foot.  Mild capsular distention of the first MTP.  No cortical irregularity of that bone. Impression: Sesamoiditis   Impression and Recommendations:     This case required medical decision making of moderate complexity. The above documentation has been reviewed and is accurate and complete Judi Saa, DO       Note: This dictation was prepared with Dragon dictation along with smaller phrase technology. Any transcriptional errors that result from this process are unintentional.

## 2019-08-21 ENCOUNTER — Other Ambulatory Visit (INDEPENDENT_AMBULATORY_CARE_PROVIDER_SITE_OTHER): Payer: 59

## 2019-08-21 ENCOUNTER — Encounter: Payer: Self-pay | Admitting: Family Medicine

## 2019-08-21 ENCOUNTER — Other Ambulatory Visit: Payer: Self-pay

## 2019-08-21 ENCOUNTER — Ambulatory Visit: Payer: Self-pay

## 2019-08-21 ENCOUNTER — Ambulatory Visit (INDEPENDENT_AMBULATORY_CARE_PROVIDER_SITE_OTHER): Payer: 59 | Admitting: Family Medicine

## 2019-08-21 VITALS — BP 120/68 | HR 72 | Ht 76.0 in | Wt 310.0 lb

## 2019-08-21 DIAGNOSIS — M79671 Pain in right foot: Secondary | ICD-10-CM

## 2019-08-21 DIAGNOSIS — Z0000 Encounter for general adult medical examination without abnormal findings: Secondary | ICD-10-CM | POA: Diagnosis not present

## 2019-08-21 DIAGNOSIS — M2580 Other specified joint disorders, unspecified joint: Secondary | ICD-10-CM

## 2019-08-21 DIAGNOSIS — M2550 Pain in unspecified joint: Secondary | ICD-10-CM

## 2019-08-21 LAB — CBC WITH DIFFERENTIAL/PLATELET
Basophils Absolute: 0 10*3/uL (ref 0.0–0.1)
Basophils Relative: 0.4 % (ref 0.0–3.0)
Eosinophils Absolute: 0.1 10*3/uL (ref 0.0–0.7)
Eosinophils Relative: 1.3 % (ref 0.0–5.0)
HCT: 43.4 % (ref 39.0–52.0)
Hemoglobin: 14.2 g/dL (ref 13.0–17.0)
Lymphocytes Relative: 44.4 % (ref 12.0–46.0)
Lymphs Abs: 2 10*3/uL (ref 0.7–4.0)
MCHC: 32.8 g/dL (ref 30.0–36.0)
MCV: 87.9 fl (ref 78.0–100.0)
Monocytes Absolute: 0.6 10*3/uL (ref 0.1–1.0)
Monocytes Relative: 12.5 % — ABNORMAL HIGH (ref 3.0–12.0)
Neutro Abs: 1.9 10*3/uL (ref 1.4–7.7)
Neutrophils Relative %: 41.4 % — ABNORMAL LOW (ref 43.0–77.0)
Platelets: 184 10*3/uL (ref 150.0–400.0)
RBC: 4.94 Mil/uL (ref 4.22–5.81)
RDW: 14.5 % (ref 11.5–15.5)
WBC: 4.6 10*3/uL (ref 4.0–10.5)

## 2019-08-21 LAB — BASIC METABOLIC PANEL
BUN: 11 mg/dL (ref 6–23)
CO2: 28 mEq/L (ref 19–32)
Calcium: 9.9 mg/dL (ref 8.4–10.5)
Chloride: 102 mEq/L (ref 96–112)
Creatinine, Ser: 1.3 mg/dL (ref 0.40–1.50)
GFR: 71.33 mL/min (ref >60.00)
Glucose, Bld: 92 mg/dL (ref 70–99)
Potassium: 4.2 mEq/L (ref 3.5–5.1)
Sodium: 138 mEq/L (ref 135–145)

## 2019-08-21 LAB — FERRITIN: Ferritin: 201.3 ng/mL (ref 22.0–322.0)

## 2019-08-21 LAB — IBC PANEL
Iron: 100 ug/dL (ref 42–165)
Saturation Ratios: 31.7 % (ref 20.0–50.0)
Transferrin: 225 mg/dL (ref 212.0–360.0)

## 2019-08-21 LAB — HEPATIC FUNCTION PANEL
ALT: 24 U/L (ref 0–53)
AST: 30 U/L (ref 0–37)
Albumin: 4.5 g/dL (ref 3.5–5.2)
Alkaline Phosphatase: 73 U/L (ref 39–117)
Bilirubin, Direct: 0.1 mg/dL (ref 0.0–0.3)
Total Bilirubin: 0.6 mg/dL (ref 0.2–1.2)
Total Protein: 7.5 g/dL (ref 6.0–8.3)

## 2019-08-21 LAB — LIPID PANEL
Cholesterol: 227 mg/dL — ABNORMAL HIGH (ref 0–200)
HDL: 40.9 mg/dL (ref >39.00)
LDL Cholesterol: 164 mg/dL — ABNORMAL HIGH (ref 0–99)
NonHDL: 185.81
Total CHOL/HDL Ratio: 6
Triglycerides: 111 mg/dL (ref 0.0–149.0)
VLDL: 22.2 mg/dL (ref 0.0–40.0)

## 2019-08-21 LAB — URIC ACID: Uric Acid, Serum: 5.6 mg/dL (ref 4.0–7.8)

## 2019-08-21 LAB — TSH: TSH: 1.22 u[IU]/mL (ref 0.35–4.50)

## 2019-08-21 LAB — PSA: PSA: 0.6 ng/mL (ref 0.10–4.00)

## 2019-08-21 LAB — SEDIMENTATION RATE: Sed Rate: 22 mm/hr — ABNORMAL HIGH (ref 0–15)

## 2019-08-21 NOTE — Assessment & Plan Note (Signed)
Continue to have difficulty with sesamoiditis, put patient in a cam walker, laboratory work-up to rule out other potential causes that could be contributing to some of the pain.  Discussed icing regimen, discussed which activities to do which wants to avoid.  Follow-up again in 3 to 4 weeks and if worsening symptoms will consider the possibility of injection

## 2019-08-21 NOTE — Progress Notes (Signed)
Hector Lee, H[place] [address] Phone: [phone] Subjective:   Hector Lee, am serving as a scribe for Dr. Hulan Saas. This visit occurred during the SARS-CoV-2 public health emergency.  Safety protocols were in place, including screening questions prior to the visit, additional usage of staff PPE, and extensive cleaning of exam room while observing appropriate contact time as indicated for disinfecting solutions.      CC: Foot pain follow-up  [id number]   07/04/2019 Sesamoiditis of the first toe on the right side.  Discussed over-the-counter orthotics, avoiding certain activities, discussed home exercise, vitamin D supplementation.  Follow-up again in 5 weeks to make sure patient is improving.  Could have some mild capsulitis and may need injection as well  Update 08/21/2019 Hector Lee is a 48 y.o. male coming in with complaint of right foot pain over the metatarsal head of the great toe. Has cutback on walking and is now riding stationary bike. Still having pain with weight bearing. Has tried icing area, TENS and is wearing a foot brace.  Continues to have pain on a daily basis.  Unable to walk long distances at the moment.    Past Medical History:  Diagnosis Date  . Hyperhydrosis disorder    Past Surgical History:  Procedure Laterality Date  . NASAL SINUS SURGERY Right 02/14/2016   Procedure: ENDOSCOPIC RIGHT MEDIAL ORBITAL WALL DECOMPRESSION;  Surgeon: Leta Baptist, MD;  Location: [facility] OR;  Service: ENT;  Laterality: Right;  . Lee PAST SURGERIES     Social History   Socioeconomic History  . Marital status: Single    Spouse name: Not on file  . Number of children: Not on file  . Years of education: Not on file  . Highest education level: Not on file  Occupational History  . Not on file  Social Needs  . Financial resource strain: Not on file  . Food insecurity    Worry: Not on file    Inability: Not on file  .  Transportation needs    Medical: Not on file    Non-medical: Not on file  Tobacco Use  . Smoking status: Never Smoker  . Smokeless tobacco: Never Used  Substance and Sexual Activity  . Alcohol use: Yes  . Drug use: Lee  . Sexual activity: Yes  Lifestyle  . Physical activity    Days per week: Not on file    Minutes per session: Not on file  . Stress: Not on file  Relationships  . Social Herbalist on phone: Not on file    Gets together: Not on file    Attends religious service: Not on file    Active member of club or organization: Not on file    Attends meetings of clubs or organizations: Not on file    Relationship status: Not on file  Other Topics Concern  . Not on file  Social History Narrative  . Not on file   Allergies  Allergen Reactions  . Shellfish Allergy Anaphylaxis   Family History  Problem Relation Age of Onset  . Diabetes Unknown        grandmother       Current Outpatient Medications (Analgesics):  .  naproxen (NAPROSYN) 500 MG tablet, Take 1 tablet (500 mg total) by mouth 2 (two) times daily.   Current Outpatient Medications (Other):  .  latanoprost (XALATAN) 0.005 % ophthalmic solution, Place 2 drops into the left eye  at bedtime. .  methocarbamol (ROBAXIN) 500 MG tablet, Take 1 tablet (500 mg total) by mouth 2 (two) times daily. .  Multiple Vitamin (MULTIVITAMIN) tablet, Take 1 tablet by mouth daily. .  NON FORMULARY, Take 1 tablet by mouth daily. Beet root supplement .  Omega-3 Fatty Acids (FISH OIL) 1000 MG CAPS, Take 1 capsule by mouth daily. .  timolol (TIMOPTIC) 0.5 % ophthalmic solution, Place 2 drops into the left eye daily.     Past medical history, social, surgical and family history all reviewed in electronic medical record.  Lee pertanent information unless stated regarding to the chief complaint.   Review of Systems:  Lee headache, visual changes, nausea, vomiting, diarrhea, constipation, dizziness, abdominal pain, skin rash,  fevers, chills, night sweats, weight loss, swollen lymph nodes, body aches, joint swelling, muscle aches, chest pain, shortness of breath, mood changes.   Objective  Blood pressure 120/68, pulse 72, height 6\' 4"  (1.93 m), weight (!) 310 lb (140.6 kg), SpO2 98 %.    General: Lee apparent distress alert and oriented x3 mood and affect normal, dressed appropriately.  HEENT: Pupils equal, extraocular movements intact  Respiratory: Patient's speak in full sentences and does not appear short of breath  Cardiovascular: Lee lower extremity edema, non tender, Lee erythema  Skin: Warm dry intact with Lee signs of infection or rash on extremities or on axial skeleton.  Abdomen: Soft nontender  Neuro: Cranial nerves II through XII are intact, neurovascularly intact in all extremities with 2+ DTRs and 2+ pulses.  Lymph: Lee lymphadenopathy of posterior or anterior cervical chain or axillae bilaterally.  Gait normal with good balance and coordination.  MSK:  Non tender with full range of motion and good stability and symmetric strength and tone of shoulders, elbows, wrist, hip, knee bilaterally.  Foot exam shows the patient does have pain over the first MTP.  Mild limited hallux limitus noted.  Negative squeeze test.  Pain more on the plantar aspect of the foot.  Pes planus noted with overpronation of the hindfoot.  Limited musculoskeletal ultrasound was performed and interpreted by  Limited musculoskeletal ultrasound shows there is some mild cortical irregularity of the sesamoid bone.  Trace effusion noted of the first and second metatarsals noted with increasing hypoechoic changes.   Impression and Recommendations:     This case required medical decision making of moderate complexity. The above documentation has been reviewed and is accurate and complete Judi Saa, DO       Note: This dictation was prepared with Dragon dictation along with smaller phrase technology. Any  transcriptional errors that result from this process are unintentional.

## 2019-08-21 NOTE — Patient Instructions (Addendum)
     [address]st floor [place], [place] [address] Phone [phone]  Boot for 2 weeks in and out of the house then 1 week out of the house with [place] downstairs See me again in 3-4 weeks

## 2019-08-22 LAB — URINALYSIS
Bilirubin Urine: NEGATIVE
Hgb urine dipstick: NEGATIVE
Ketones, ur: NEGATIVE
Leukocytes,Ua: NEGATIVE
Nitrite: NEGATIVE
Specific Gravity, Urine: 1.025 (ref 1.000–1.030)
Total Protein, Urine: NEGATIVE
Urine Glucose: NEGATIVE
Urobilinogen, UA: 0.2 (ref 0.0–1.0)
pH: 6 (ref 5.0–8.0)

## 2019-08-24 LAB — VITAMIN D 1,25 DIHYDROXY
Vitamin D 1, 25 (OH)2 Total: 60 pg/mL (ref 18–72)
Vitamin D2 1, 25 (OH)2: <8 pg/mL
Vitamin D3 1, 25 (OH)2: 60 pg/mL

## 2019-09-11 ENCOUNTER — Ambulatory Visit (INDEPENDENT_AMBULATORY_CARE_PROVIDER_SITE_OTHER): Payer: 59 | Admitting: Family Medicine

## 2019-09-11 ENCOUNTER — Ambulatory Visit (INDEPENDENT_AMBULATORY_CARE_PROVIDER_SITE_OTHER): Payer: 59

## 2019-09-11 ENCOUNTER — Other Ambulatory Visit: Payer: Self-pay

## 2019-09-11 VITALS — BP 126/74 | HR 75 | Ht 76.0 in | Wt 312.0 lb

## 2019-09-11 DIAGNOSIS — M2580 Other specified joint disorders, unspecified joint: Secondary | ICD-10-CM | POA: Diagnosis not present

## 2019-09-11 DIAGNOSIS — M79671 Pain in right foot: Secondary | ICD-10-CM

## 2019-09-11 NOTE — Patient Instructions (Signed)
Put straps on bike pedals Keep doing exercises Ice after activity See me in 2 months if not better

## 2019-09-11 NOTE — Progress Notes (Signed)
Hector Lee [address] [place] [address] Phone: [phone] Subjective:   [name], am serving as a scribe for Dr. Antoine Primas. This visit occurred during the SARS-CoV-2 public health emergency.  Safety protocols were in place, including screening questions prior to the visit, additional usage of staff PPE, and extensive cleaning of exam room while observing appropriate contact time as indicated for disinfecting solutions.    CC: Foot pain follow-up  [id number]   08/21/2019 Continue to have difficulty with sesamoiditis, put patient in a cam walker, laboratory work-up to rule out other potential causes that could be contributing to some of the pain.  Discussed icing regimen, discussed which activities to do which wants to avoid.  Follow-up again in 3 to 4 weeks and if worsening symptoms will consider the possibility of injection  Update 09/11/2019 Hector Lee is a 48 y.o. male coming in with complaint of right foot pain. Patient states that he has been riding a stationary bike. Does use ice after biking to alleviate soreness. Does have pain with weight bearing but it is less than before.       Past Medical History:  Diagnosis Date  . Hyperhydrosis disorder    Past Surgical History:  Procedure Laterality Date  . NASAL SINUS SURGERY Right 02/14/2016   Procedure: ENDOSCOPIC RIGHT MEDIAL ORBITAL WALL DECOMPRESSION;  Surgeon: Newman Pies, MD;  Location: [facility] OR;  Service: ENT;  Laterality: Right;  . NO PAST SURGERIES     Social History   Socioeconomic History  . Marital status: Single    Spouse name: Not on file  . Number of children: Not on file  . Years of education: Not on file  . Highest education level: Not on file  Occupational History  . Not on file  Tobacco Use  . Smoking status: Never Smoker  . Smokeless tobacco: Never Used  Substance and Sexual Activity  . Alcohol use: Yes  . Drug use: No  . Sexual activity: Yes   Other Topics Concern  . Not on file  Social History Narrative  . Not on file   Social Determinants of Health   Financial Resource Strain:   . Difficulty of Paying Living Expenses: Not on file  Food Insecurity:   . Worried About Programme researcher, broadcasting/film/video in the Last Year: Not on file  . Ran Out of Food in the Last Year: Not on file  Transportation Needs:   . Lack of Transportation (Medical): Not on file  . Lack of Transportation (Non-Medical): Not on file  Physical Activity:   . Days of Exercise per Week: Not on file  . Minutes of Exercise per Session: Not on file  Stress:   . Feeling of Stress : Not on file  Social Connections:   . Frequency of Communication with Friends and Family: Not on file  . Frequency of Social Gatherings with Friends and Family: Not on file  . Attends Religious Services: Not on file  . Active Member of Clubs or Organizations: Not on file  . Attends Banker Meetings: Not on file  . Marital Status: Not on file   Allergies  Allergen Reactions  . Shellfish Allergy Anaphylaxis   Family History  Problem Relation Age of Onset  . Diabetes Unknown        grandmother       Current Outpatient Medications (Analgesics):  .  naproxen (NAPROSYN) 500 MG tablet, Take 1 tablet (500 mg total) by  mouth 2 (two) times daily.   Current Outpatient Medications (Other):  .  latanoprost (XALATAN) 0.005 % ophthalmic solution, Place 2 drops into the left eye at bedtime. .  methocarbamol (ROBAXIN) 500 MG tablet, Take 1 tablet (500 mg total) by mouth 2 (two) times daily. .  Multiple Vitamin (MULTIVITAMIN) tablet, Take 1 tablet by mouth daily. .  NON FORMULARY, Take 1 tablet by mouth daily. Beet root supplement .  Omega-3 Fatty Acids (FISH OIL) 1000 MG CAPS, Take 1 capsule by mouth daily. .  timolol (TIMOPTIC) 0.5 % ophthalmic solution, Place 2 drops into the left eye daily.     Past medical history, social, surgical and family history all reviewed in  electronic medical record.  No pertanent information unless stated regarding to the chief complaint.   Review of Systems:  No headache, visual changes, nausea, vomiting, diarrhea, constipation, dizziness, abdominal pain, skin rash, fevers, chills, night sweats, weight loss, swollen lymph nodes, body aches, joint swelling,chest pain, shortness of breath, mood changes.  Positive muscle aches  Objective  Blood pressure 126/74, pulse 75, height 6\' 4"  (1.93 m), weight (!) 312 lb (141.5 kg), SpO2 99 %.    General: No apparent distress alert and oriented x3 mood and affect normal, dressed appropriately.  HEENT: Pupils equal, extraocular movements intact  Respiratory: Patient's speak in full sentences and does not appear short of breath  Cardiovascular: No lower extremity edema, non tender, no erythema  Skin: Warm dry intact with no signs of infection or rash on extremities or on axial skeleton.  Abdomen: Soft nontender  Neuro: Cranial nerves II through XII are intact, neurovascularly intact in all extremities with 2+ DTRs and 2+ pulses.  Lymph: No lymphadenopathy of posterior or anterior cervical chain or axillae bilaterally.  Gait normal with good balance and coordination.  MSK:  tender with limited range of motion and good stability and symmetric strength and tone of shoulders, elbows, wrist, hip, knee and ankles bilaterally.  Foot exam still shows the breakdown of the longitudinal arch as well as the transverse arch.  Patient is still minimally tender over the sesamoid bone of the left foot at the first metatarsal.  No swelling noted.  Definitely improvement from previous exam now.    Impression and Recommendations:      The above documentation has been reviewed and is accurate and complete Lyndal Pulley, DO       Note: This dictation was prepared with Dragon dictation along with smaller phrase technology. Any transcriptional errors that result from this process are unintentional.

## 2019-09-12 ENCOUNTER — Encounter: Payer: Self-pay | Admitting: Family Medicine

## 2019-09-12 NOTE — Assessment & Plan Note (Signed)
Patient is relatively doing much better.  Seem to have more of a sesamoiditis.  Patient at this point as long as doing well can follow-up more on an as-needed basis.  Patient is concerned though because continuing to have some discomfort and would like to have another follow-up in 2 months.  I think that that is fine at the moment.  Discussed still avoiding being barefoot and high impact exercises.

## 2019-11-07 ENCOUNTER — Other Ambulatory Visit: Payer: Self-pay

## 2019-11-07 ENCOUNTER — Ambulatory Visit: Payer: 59 | Admitting: Family Medicine

## 2019-11-07 NOTE — Progress Notes (Signed)
[name] [address] [place] [address] Phone: [phone] Subjective:   Hector Lee, am serving as a scribe for Dr. Antoine Primas. This visit occurred during the SARS-CoV-2 public health emergency.  Safety protocols were in place, including screening questions prior to the visit, additional usage of staff PPE, and extensive cleaning of exam room while observing appropriate contact time as indicated for disinfecting solutions.   I'm seeing this patient by the request  of:  Plotnikov, Georgina Quint, MD  CC: foot pain follow up   [id number]   09/11/2019 Patient is relatively doing much better.  Seem to have more of a sesamoiditis.  Patient at this point as long as doing well can follow-up more on an as-needed basis.  Patient is concerned though because continuing to have some discomfort and would like to have another follow-up in 2 months.  I think that that is fine at the moment.  Discussed still avoiding being barefoot and high impact exercises.  Update 11/07/2019 Hector Lee is a 49 y.o. male coming in with complaint of right foot pain. Patient states that he is doing better than last visit. Went to shoe store and got some new insoles made which has helped to take pressure off the area of his foot that was bothersome. Over time his pain has improved.       Past Medical History:  Diagnosis Date  . Hyperhydrosis disorder    Past Surgical History:  Procedure Laterality Date  . NASAL SINUS SURGERY Right 02/14/2016   Procedure: ENDOSCOPIC RIGHT MEDIAL ORBITAL WALL DECOMPRESSION;  Surgeon: Newman Pies, MD;  Location: [facility] OR;  Service: ENT;  Laterality: Right;  . NO PAST SURGERIES     Social History   Socioeconomic History  . Marital status: Single    Spouse name: Not on file  . Number of children: Not on file  . Years of education: Not on file  . Highest education level: Not on file  Occupational History  . Not on file  Tobacco Use  .  Smoking status: Never Smoker  . Smokeless tobacco: Never Used  Substance and Sexual Activity  . Alcohol use: Yes  . Drug use: No  . Sexual activity: Yes  Other Topics Concern  . Not on file  Social History Narrative  . Not on file   Social Determinants of Health   Financial Resource Strain:   . Difficulty of Paying Living Expenses: Not on file  Food Insecurity:   . Worried About Programme researcher, broadcasting/film/video in the Last Year: Not on file  . Ran Out of Food in the Last Year: Not on file  Transportation Needs:   . Lack of Transportation (Medical): Not on file  . Lack of Transportation (Non-Medical): Not on file  Physical Activity:   . Days of Exercise per Week: Not on file  . Minutes of Exercise per Session: Not on file  Stress:   . Feeling of Stress : Not on file  Social Connections:   . Frequency of Communication with Friends and Family: Not on file  . Frequency of Social Gatherings with Friends and Family: Not on file  . Attends Religious Services: Not on file  . Active Member of Clubs or Organizations: Not on file  . Attends Banker Meetings: Not on file  . Marital Status: Not on file   Allergies  Allergen Reactions  . Shellfish Allergy Anaphylaxis   Family History  Problem Relation Age  of Onset  . Diabetes Unknown        grandmother       Current Outpatient Medications (Analgesics):  .  naproxen (NAPROSYN) 500 MG tablet, Take 1 tablet (500 mg total) by mouth 2 (two) times daily.   Current Outpatient Medications (Other):  .  latanoprost (XALATAN) 0.005 % ophthalmic solution, Place 2 drops into the left eye at bedtime. .  methocarbamol (ROBAXIN) 500 MG tablet, Take 1 tablet (500 mg total) by mouth 2 (two) times daily. .  Multiple Vitamin (MULTIVITAMIN) tablet, Take 1 tablet by mouth daily. .  NON FORMULARY, Take 1 tablet by mouth daily. Beet root supplement .  Omega-3 Fatty Acids (FISH OIL) 1000 MG CAPS, Take 1 capsule by mouth daily. .  timolol (TIMOPTIC)  0.5 % ophthalmic solution, Place 2 drops into the left eye daily.    Reviewed prior external information including notes and imaging from  primary care provider As well as notes that were available from care everywhere and other healthcare systems.  Past medical history, social, surgical and family history all reviewed in electronic medical record.  No pertanent information unless stated regarding to the chief complaint.   Review of Systems:  No headache, visual changes, nausea, vomiting, diarrhea, constipation, dizziness, abdominal pain, skin rash, fevers, chills, night sweats, weight loss, swollen lymph nodes, body aches, joint swelling, chest pain, shortness of breath, mood changes. POSITIVE muscle aches  Objective  There were no vitals taken for this visit.   General: No apparent distress alert and oriented x3 mood and affect normal, dressed appropriately.  HEENT: Pupils equal, extraocular movements intact  Respiratory: Patient's speak in full sentences and does not appear short of breath  Cardiovascular: No lower extremity edema, non tender, no erythema  Skin: Warm dry intact with no signs of infection or rash on extremities or on axial skeleton.  Abdomen: Soft nontender  Neuro: Cranial nerves II through XII are intact, neurovascularly intact in all extremities with 2+ DTRs and 2+ pulses.  Lymph: No lymphadenopathy of posterior or anterior cervical chain or axillae bilaterally.  Gait normal with good balance and coordination.  MSK:  Non tender with full range of motion and good stability and symmetric strength and tone of shoulders, elbows, wrist, hip, knee and ankles bilaterally.     Impression and Recommendations:     This case required medical decision making of moderate complexity. The above documentation has been reviewed and is accurate and complete Lyndal Pulley, DO       Note: This dictation was prepared with Dragon dictation along with smaller phrase technology.  Any transcriptional errors that result from this process are unintentional.

## 2019-11-23 ENCOUNTER — Encounter: Payer: Self-pay | Admitting: Family Medicine

## 2019-12-27 ENCOUNTER — Ambulatory Visit: Payer: Self-pay | Admitting: Family Medicine

## 2020-06-14 IMAGING — DX DG FOOT COMPLETE 3+V*R*
3 series · 3 of 3 positions shown · non-contrast
Comparison: None.

CLINICAL DATA: Pain about the right midfoot for 4 weeks. No known
injury.

EXAM:
RIGHT FOOT COMPLETE - 3+ VIEW

[foot ap]
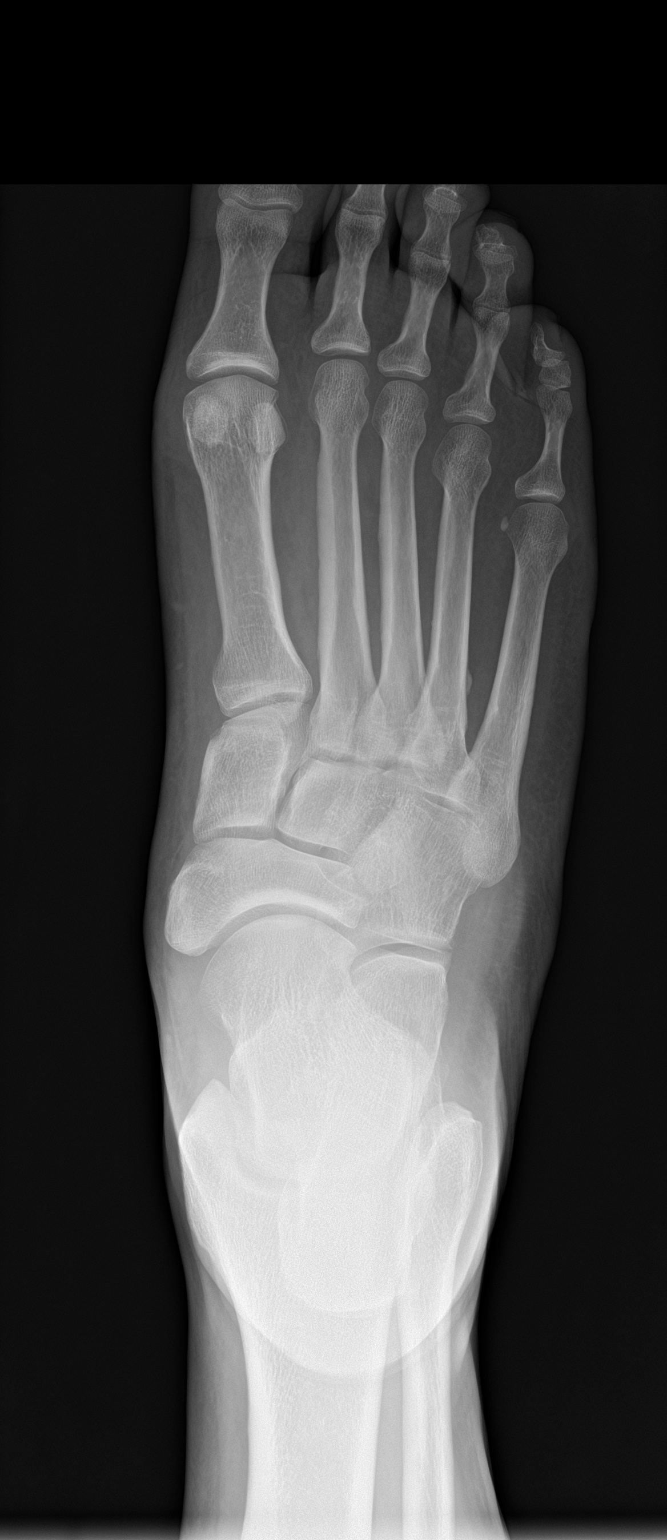

[foot obl]
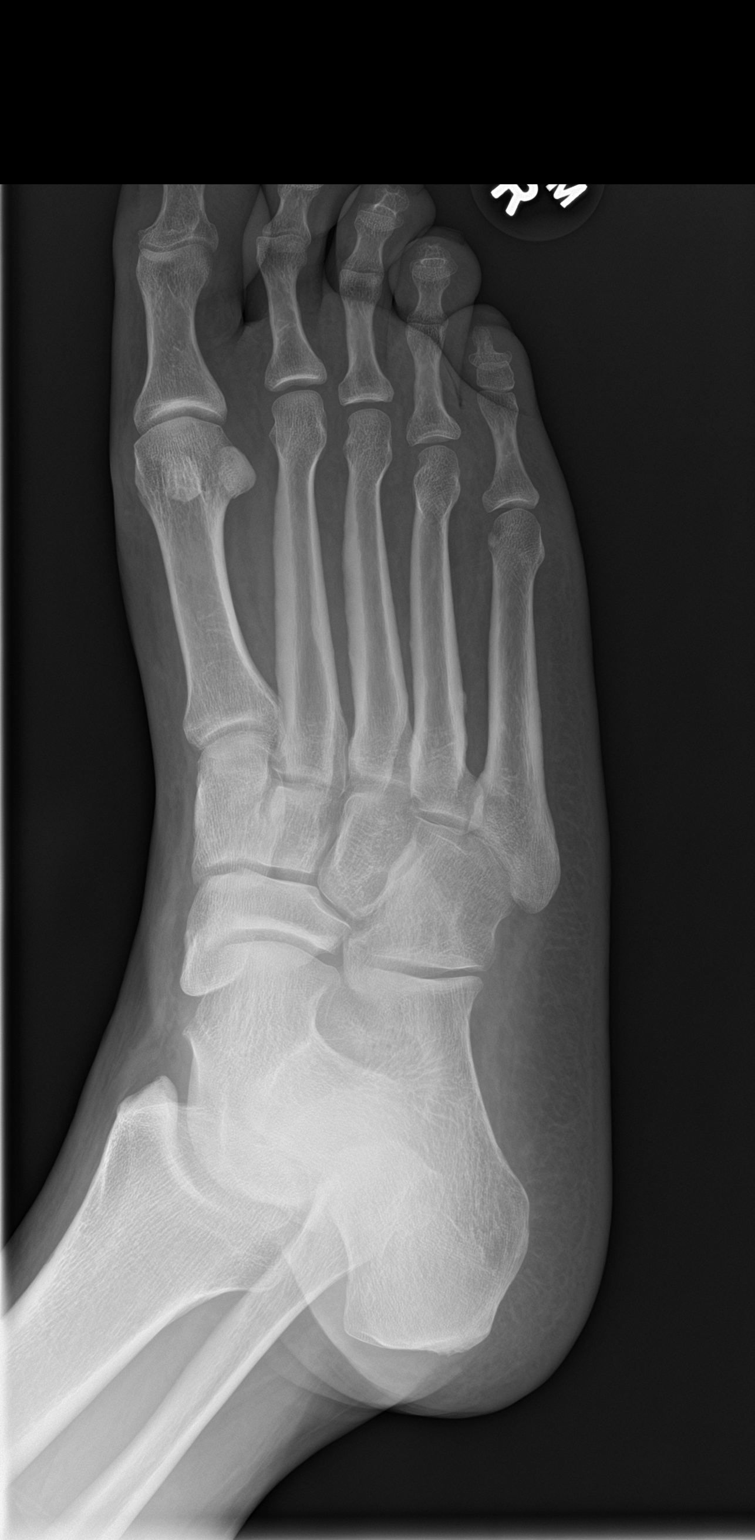

[foot lat]
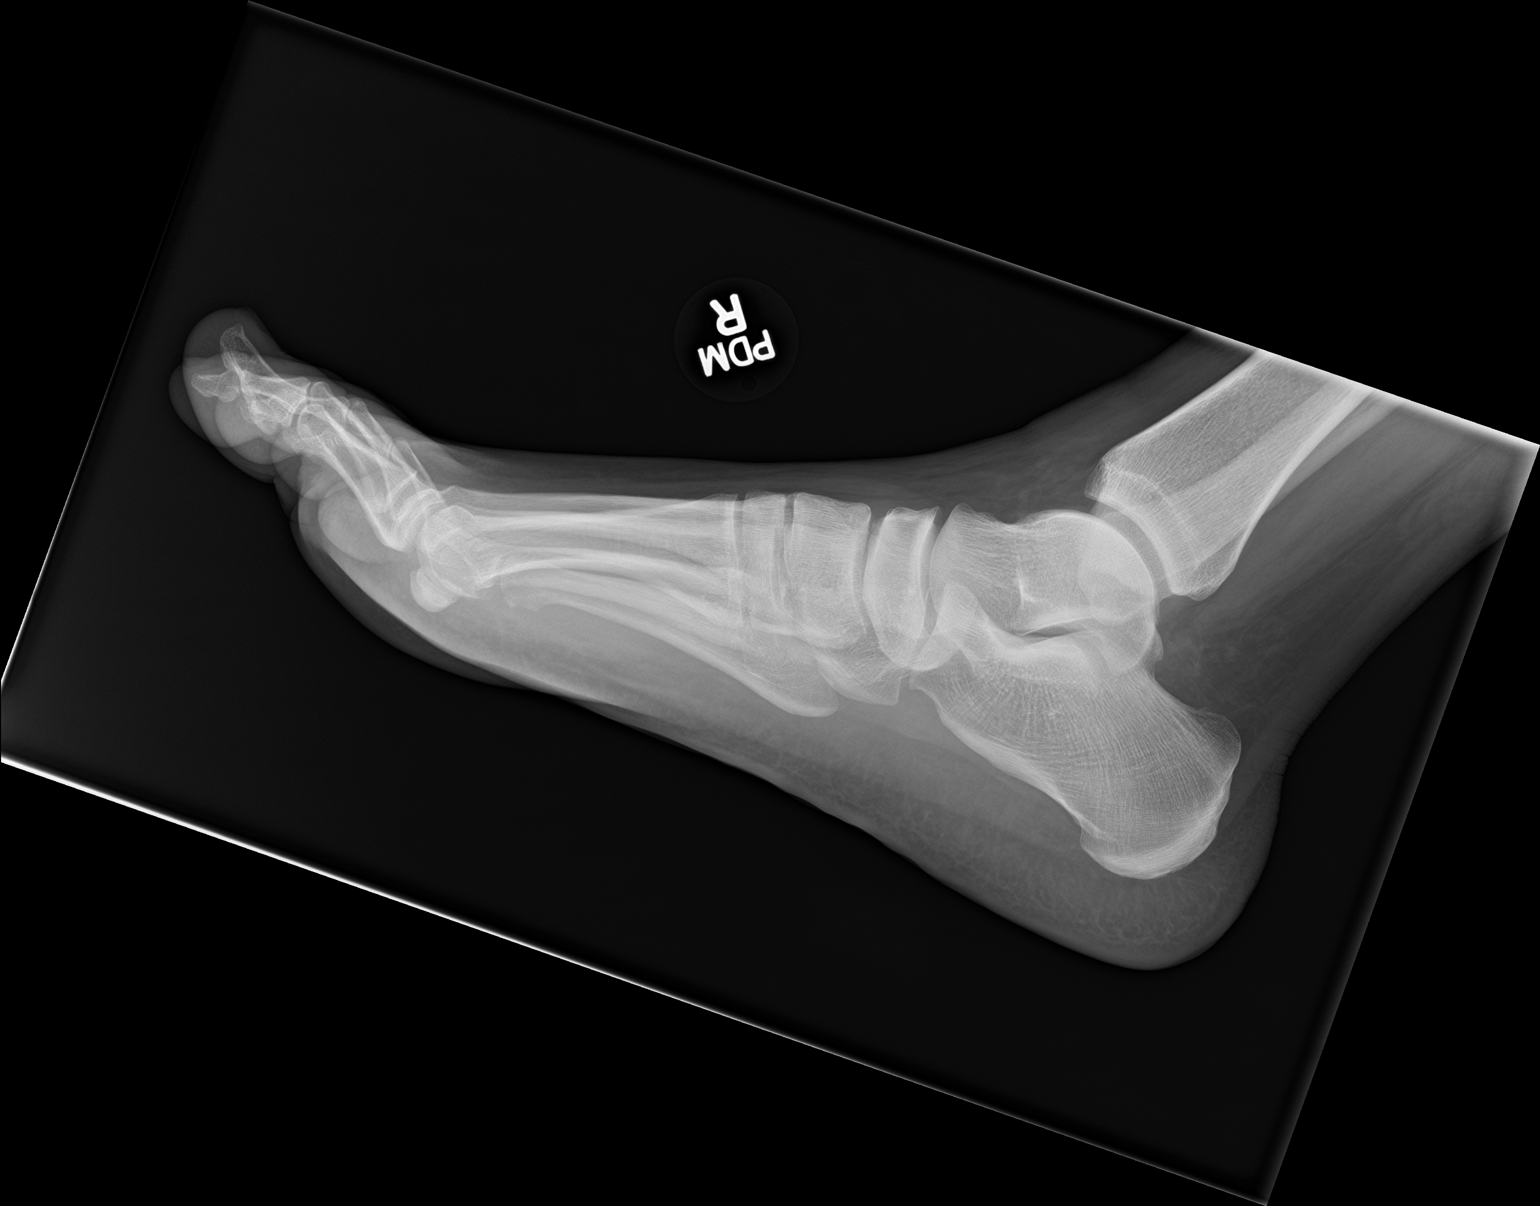

[3 of 3 positions shown; findings below may reference images not displayed]

FINDINGS: There is no evidence of fracture or dislocation. There is no
evidence of arthropathy or other focal bone abnormality. Soft
tissues are unremarkable.
IMPRESSION: Normal exam.

## 2020-07-30 ENCOUNTER — Ambulatory Visit: Payer: Managed Care, Other (non HMO) | Admitting: Internal Medicine

## 2020-07-31 ENCOUNTER — Encounter: Payer: Self-pay | Admitting: Family

## 2020-07-31 ENCOUNTER — Other Ambulatory Visit: Payer: Self-pay

## 2020-07-31 ENCOUNTER — Ambulatory Visit: Payer: Managed Care, Other (non HMO) | Admitting: Family

## 2020-07-31 VITALS — BP 140/80 | HR 78 | Temp 98.0°F | Ht 76.0 in | Wt 308.6 lb

## 2020-07-31 DIAGNOSIS — H6690 Otitis media, unspecified, unspecified ear: Secondary | ICD-10-CM

## 2020-07-31 MED ORDER — AMOXICILLIN-POT CLAVULANATE 875-125 MG PO TABS: 1.0000 | ORAL_TABLET | Freq: Two times a day (BID) | ORAL | 0 refills | Status: AC

## 2020-07-31 MED ORDER — NEOMYCIN-POLYMYXIN-HC 1 % OT SOLN: 3.0000 [drp] | Freq: Four times a day (QID) | OTIC | 0 refills | Status: AC
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
## 2021-07-12 DIAGNOSIS — H40012 Open angle with borderline findings, low risk, left eye: Secondary | ICD-10-CM | POA: Diagnosis not present

## 2021-07-12 DIAGNOSIS — H3411 Central retinal artery occlusion, right eye: Secondary | ICD-10-CM | POA: Diagnosis not present

## 2021-07-12 DIAGNOSIS — H2512 Age-related nuclear cataract, left eye: Secondary | ICD-10-CM | POA: Diagnosis not present

## 2021-09-17 NOTE — Progress Notes (Signed)
[name] [address] [place] [address] Phone: [phone] Subjective:   Hector Lee, am serving as a scribe for Dr. Antoine Primas. This visit occurred during the SARS-CoV-2 public health emergency.  Safety protocols were in place, including screening questions prior to the visit, additional usage of staff PPE, and extensive cleaning of exam room while observing appropriate contact time as indicated for disinfecting solutions.  I'm seeing this patient by the request  of:  Plotnikov, Georgina Quint, MD  CC: right foot pain   [id number]  Hector Lee is a 51 y.o. male coming in with complaint of R foot pain for past 3 weeks. Seen in 2020 for sesamoiditis. States that he has a callus over 4th metatarsal. When he walks barefoot he feels like he is walking on something. Using a metatarsal cushion that he ordered off of [employer]. Patient also mentions ordering a bigger shoe. Patient enjoys walking and biking for exercise.      Past Medical History:  Diagnosis Date   Hyperhydrosis disorder    Past Surgical History:  Procedure Laterality Date   NASAL SINUS SURGERY Right 02/14/2016   Procedure: ENDOSCOPIC RIGHT MEDIAL ORBITAL WALL DECOMPRESSION;  Surgeon: Newman Pies, MD;  Location: [facility] OR;  Service: ENT;  Laterality: Right;   NO PAST SURGERIES     Social History   Socioeconomic History   Marital status: Single    Spouse name: Not on file   Number of children: Not on file   Years of education: Not on file   Highest education level: Not on file  Occupational History   Not on file  Tobacco Use   Smoking status: Never   Smokeless tobacco: Never  Substance and Sexual Activity   Alcohol use: Yes   Drug use: No   Sexual activity: Yes  Other Topics Concern   Not on file  Social History Narrative   Not on file   Social Determinants of Health   Financial Resource Strain: Not on file  Food Insecurity: Not on file  Transportation Needs: Not on  file  Physical Activity: Not on file  Stress: Not on file  Social Connections: Not on file   Allergies  Allergen Reactions   Shellfish Allergy Anaphylaxis   Family History  Problem Relation Age of Onset   Diabetes Unknown        grandmother       Current Outpatient Medications (Analgesics):    naproxen (NAPROSYN) 500 MG tablet, Take 1 tablet (500 mg total) by mouth 2 (two) times daily.   Current Outpatient Medications (Other):    latanoprost (XALATAN) 0.005 % ophthalmic solution, Place 2 drops into the left eye at bedtime.   methocarbamol (ROBAXIN) 500 MG tablet, Take 1 tablet (500 mg total) by mouth 2 (two) times daily.   Multiple Vitamin (MULTIVITAMIN) tablet, Take 1 tablet by mouth daily.   NEOMYCIN-POLYMYXIN-HYDROCORTISONE (CORTISPORIN) 1 % SOLN OTIC solution, Place 3 drops into the left ear every 6 (six) hours.   NON FORMULARY, Take 1 tablet by mouth daily. Beet root supplement   Omega-3 Fatty Acids (FISH OIL) 1000 MG CAPS, Take 1 capsule by mouth daily.   timolol (TIMOPTIC) 0.5 % ophthalmic solution, Place 2 drops into the left eye daily.    Vitamin D, Ergocalciferol, (DRISDOL) 1.25 MG (50000 UNIT) CAPS capsule, Take 1 capsule (50,000 Units total) by mouth every 7 (seven) days.   Reviewed prior external information including notes and imaging from  primary care  provider As well as notes that were available from care everywhere and other healthcare systems.  Past medical history, social, surgical and family history all reviewed in electronic medical record.  No pertanent information unless stated regarding to the chief complaint.   Review of Systems:  No headache, visual changes, nausea, vomiting, diarrhea, constipation, dizziness, abdominal pain, skin rash, fevers, chills, night sweats, weight loss, swollen lymph nodes, body aches, joint swelling, chest pain, shortness of breath, mood changes. POSITIVE muscle aches  Objective  Blood pressure (!) 148/86, pulse 74,  height 6\' 4"  (1.93 m), weight (!) 317 lb (143.8 kg), SpO2 99 %.   General: No apparent distress alert and oriented x3 mood and affect normal, dressed appropriately.  HEENT: Pupils equal, extraocular movements intact  Respiratory: Patient's speak in full sentences and does not appear short of breath  Cardiovascular: No lower extremity edema, non tender, no erythema  Gait normal with good balance and coordination.  MSK:  foot exam shows patient does have breakdown of the transverse arch with pes planus noted.  Patient does have breakdown of also more between the first and second toes with splaying.  Patient does have tenderness over the sesamoid bone of the first metatarsal.  Minimal pain over the fourth toe noted.  No callus formations noted on the skin level.  Limited muscular skeletal ultrasound was performed and interpreted by , M  Limited musculoskeletal ultrasound shows patient sesamoid bone under the first metatarsal does have a cortical irregularity noted with callus formation noted.  Seems to be partially soft as well as hard callus.  Mild increase in neovascularization. Impression: Sesamoiditis with possible cortical irregularity likely secondary to a stress reaction.    Impression and Recommendations:     The above documentation has been reviewed and is accurate and complete Antoine Primas, DO

## 2021-09-21 ENCOUNTER — Encounter: Payer: Self-pay | Admitting: Family Medicine

## 2021-09-21 ENCOUNTER — Ambulatory Visit: Payer: BC Managed Care – PPO | Admitting: Family Medicine

## 2021-09-21 ENCOUNTER — Ambulatory Visit: Payer: Self-pay

## 2021-09-21 ENCOUNTER — Other Ambulatory Visit: Payer: Self-pay

## 2021-09-21 VITALS — BP 148/86 | HR 74 | Ht 76.0 in | Wt 317.0 lb

## 2021-09-21 DIAGNOSIS — M25871 Other specified joint disorders, right ankle and foot: Secondary | ICD-10-CM

## 2021-09-21 DIAGNOSIS — M79671 Pain in right foot: Secondary | ICD-10-CM

## 2021-09-21 MED ORDER — VITAMIN D (ERGOCALCIFEROL) 1.25 MG (50000 UNIT) PO CAPS: 50000.0000 [IU] | ORAL_CAPSULE | ORAL | 0 refills | Status: DC

## 2021-09-21 NOTE — Patient Instructions (Signed)
Custom orthotics Once weekly Vit D HOKA recovery sandals See me again in 5 weeks

## 2021-09-21 NOTE — Assessment & Plan Note (Signed)
Worsening pain, may have stress reaction./  Custom orthotics ordered.  HEP Recovery sandals suggested discussed with patient about avoiding certain footwear.  Patient is going to continue to be active.  Discussed with patient to avoid being barefoot.  Follow-up again in 4 to 6 weeks.

## 2021-09-27 ENCOUNTER — Ambulatory Visit: Payer: BC Managed Care – PPO | Admitting: Family Medicine

## 2021-09-27 ENCOUNTER — Encounter: Payer: Self-pay | Admitting: Family Medicine

## 2021-09-27 VITALS — BP 136/84 | Ht 76.0 in | Wt 315.0 lb

## 2021-09-27 DIAGNOSIS — M79671 Pain in right foot: Secondary | ICD-10-CM | POA: Diagnosis not present

## 2021-09-27 NOTE — Progress Notes (Signed)
° ° °  SUBJECTIVE:   CHIEF COMPLAINT / HPI: right foot pain  Ongoing for 2 years.  Recently seen by Dr Tamala Julian who recommended Orthotics for pain.  Patient reports worsening pain when walking and pressure is applied on bottom of foot specifically forefoot and base of great toe.  Denies any trauma, recent injury, bruising or swelling.    PERTINENT  PMH / PSH:  Chronic right foot pain  OBJECTIVE:   BP 136/84    Ht 6\' 4"  (1.93 m)    Wt (!) 315 lb (142.9 kg)    BMI 38.34 kg/m    General: Alert, no acute distress Right Foot: Inspection: No erythema, ecchymosis or edema. Pes planus.  Loss of transverse arch with mild splaying 1st and 2nd digits.  Calcaneal valgus. Palpation:  Pain elicited over sesamoids 1st MTP. Full ROM - no pain dorsiflexion of great toe. Strength 5/5 ankle. Sensation intact, pulses present and well perfused Gait mild external rotation at ankles, right > left Anterior drawer negative Talar's tilt negative Compression test negative  ASSESSMENT/PLAN:   Right foot pain Pain likely combination of Sesamoiditis and metatarsalgia. - Custom orthotics with metatarsal pads bilaterally, sesamoid cutout on right. -Follow up with Dr Barbaraann Barthel as needed   Patient was fitted for a : standard, cushioned, semi-rigid orthotic. The orthotic was heated and afterward the patient stood on the orthotic blank positioned on the orthotic stand. The patient was positioned in subtalar neutral position and 10 degrees of ankle dorsiflexion in a weight bearing stance. After completion of molding, a stable base was applied to the orthotic blank. The blank was ground to a stable position for weight bearing. Size: 16 Base: blue med density eva Posting: none Additional orthotic padding: medium metatarsal pads bilaterally; sesamoid cutout on right.  Carollee Leitz, MD [place]

## 2021-09-27 NOTE — Patient Instructions (Addendum)
Thank you for coming to see me today. It was a pleasure.   Use Orthotics daily to help off load pressure on your feet.    Please follow-up with Dr Pearletha Forge in 2 weeks if no improvement  If you have any questions or concerns, please do not hesitate to call the office  Best,   Dana Allan, MD

## 2021-09-27 NOTE — Assessment & Plan Note (Addendum)
Pain likely combination of Sesamoiditis and Pes Planus -Will trial Orthotics with double up of sesamoid pad and metatarsal pads -Follow up with Dr Pearletha Forge as needed

## 2021-11-09 DIAGNOSIS — H40012 Open angle with borderline findings, low risk, left eye: Secondary | ICD-10-CM | POA: Diagnosis not present

## 2021-11-09 DIAGNOSIS — H2512 Age-related nuclear cataract, left eye: Secondary | ICD-10-CM | POA: Diagnosis not present

## 2021-11-09 DIAGNOSIS — H43392 Other vitreous opacities, left eye: Secondary | ICD-10-CM | POA: Diagnosis not present

## 2021-11-09 DIAGNOSIS — H3411 Central retinal artery occlusion, right eye: Secondary | ICD-10-CM | POA: Diagnosis not present

## 2021-12-11 ENCOUNTER — Other Ambulatory Visit: Payer: Self-pay | Admitting: Family Medicine

## 2022-01-10 DIAGNOSIS — H43392 Other vitreous opacities, left eye: Secondary | ICD-10-CM | POA: Diagnosis not present

## 2022-01-10 DIAGNOSIS — H40012 Open angle with borderline findings, low risk, left eye: Secondary | ICD-10-CM | POA: Diagnosis not present

## 2022-02-27 DIAGNOSIS — H60332 Swimmer's ear, left ear: Secondary | ICD-10-CM | POA: Diagnosis not present

## 2022-03-17 ENCOUNTER — Ambulatory Visit: Payer: BC Managed Care – PPO | Admitting: Family Medicine

## 2022-03-17 ENCOUNTER — Encounter: Payer: Self-pay | Admitting: Family Medicine

## 2022-03-17 VITALS — BP 136/80 | HR 64 | Temp 97.5°F | Ht 76.0 in | Wt 311.0 lb

## 2022-03-17 DIAGNOSIS — H66002 Acute suppurative otitis media without spontaneous rupture of ear drum, left ear: Secondary | ICD-10-CM | POA: Diagnosis not present

## 2022-03-17 MED ORDER — AMOXICILLIN 875 MG PO TABS: 875.0000 mg | ORAL_TABLET | Freq: Two times a day (BID) | ORAL | 0 refills | Status: AC

## 2022-03-17 NOTE — Progress Notes (Signed)
Subjective:     Patient ID: Hector Lee, male    DOB: [birth year], 51 y.o.   MRN: [id number]  Chief Complaint  Patient presents with   Ear Drainage    Ear drainage in left ear, thinks he may have punctured ear drum. Went to [facility] on 6/18 and they gave him steroid antibiotic.    HPI Patient is in today for left ear pain and feeling clogged.  Denies recent swimming, hot tub or head submersion in water.  Urgent care visit on 02/27/2022 and diagnosed with swimmer's ear, prescribed Cortisporin which he used with some relief.  Continues to have decreased hearing in his left ear.  Denies fever, chills, headache, dizziness, rhinorrhea, sneezing, sore throat, cough.  Health Maintenance Due  Topic Date Due   COVID-19 Vaccine (1) Never done   HIV Screening  Never done   Hepatitis C Screening  Never done   COLONOSCOPY (Pts 45-11yrs Insurance coverage will need to be confirmed)  Never done   Zoster Vaccines- Shingrix (1 of 2) Never done    Past Medical History:  Diagnosis Date   Hyperhydrosis disorder     Past Surgical History:  Procedure Laterality Date   NASAL SINUS SURGERY Right 02/14/2016   Procedure: ENDOSCOPIC RIGHT MEDIAL ORBITAL WALL DECOMPRESSION;  Surgeon: Hector Pies, MD;  Location: [facility] OR;  Service: ENT;  Laterality: Right;   NO PAST SURGERIES      Family History  Problem Relation Age of Onset   Diabetes Unknown        grandmother    Social History   Socioeconomic History   Marital status: Single    Spouse name: Not on file   Number of children: Not on file   Years of education: Not on file   Highest education level: Not on file  Occupational History   Not on file  Tobacco Use   Smoking status: Never   Smokeless tobacco: Never  Substance and Sexual Activity   Alcohol use: Yes   Drug use: No   Sexual activity: Yes  Other Topics Concern   Not on file  Social History Narrative   Not on file   Social Determinants of Health   Financial Resource Strain: Not  on file  Food Insecurity: Not on file  Transportation Needs: Not on file  Physical Activity: Not on file  Stress: Not on file  Social Connections: Not on file  Intimate Partner Violence: Not on file    Outpatient Medications Prior to Visit  Medication Sig Dispense Refill   latanoprost (XALATAN) 0.005 % ophthalmic solution Place 2 drops into the left eye at bedtime.     methocarbamol (ROBAXIN) 500 MG tablet Take 1 tablet (500 mg total) by mouth 2 (two) times daily. 20 tablet 0   Multiple Vitamin (MULTIVITAMIN) tablet Take 1 tablet by mouth daily.     naproxen (NAPROSYN) 500 MG tablet Take 1 tablet (500 mg total) by mouth 2 (two) times daily. 30 tablet 0   NON FORMULARY Take 1 tablet by mouth daily. Beet root supplement     Omega-3 Fatty Acids (FISH OIL) 1000 MG CAPS Take 1 capsule by mouth daily.     timolol (TIMOPTIC) 0.5 % ophthalmic solution Place 2 drops into the left eye daily.      Vitamin D, Ergocalciferol, (DRISDOL) 1.25 MG (50000 UNIT) CAPS capsule TAKE 1 CAPSULE BY MOUTH EVERY 7 DAYS 12 capsule 0   NEOMYCIN-POLYMYXIN-HYDROCORTISONE (CORTISPORIN) 1 % SOLN OTIC solution Place 3 drops into  the left ear every 6 (six) hours. (Patient not taking: Reported on 03/17/2022) 10 mL 0   No facility-administered medications prior to visit.    Allergies  Allergen Reactions   Shellfish Allergy Anaphylaxis    ROS Pertinent positives and negatives in the history of present illness.     Objective:    Physical Exam Constitutional:      General: He is not in acute distress.    Appearance: Normal appearance. He is not ill-appearing.  HENT:     Right Ear: Hearing, tympanic membrane, ear canal and external ear normal. No mastoid tenderness.     Left Ear: External ear normal. Decreased hearing noted. No mastoid tenderness.     Ears:     Comments: Left ear with exudate noted, and unable to visualize TM completely due to mildly edematous ear canal.  No tragus tenderness.  Normal external ear.   No lymphadenopathy.    Mouth/Throat:     Mouth: Mucous membranes are moist.     Pharynx: Oropharynx is clear.  Eyes:     Extraocular Movements: Extraocular movements intact.     Conjunctiva/sclera: Conjunctivae normal.     Pupils: Pupils are equal, round, and reactive to light.  Cardiovascular:     Rate and Rhythm: Normal rate.  Pulmonary:     Effort: Pulmonary effort is normal.  Musculoskeletal:     Cervical back: Normal range of motion and neck supple. No tenderness.  Lymphadenopathy:     Cervical: No cervical adenopathy.  Neurological:     Mental Status: He is alert.     BP 136/80 (BP Location: Left Arm, Patient Position: Sitting, Cuff Size: Large)   Pulse 64   Temp (!) 97.5 F (36.4 C) (Temporal)   Ht 6\' 4"  (1.93 m)   Wt (!) 311 lb (141.1 kg)   SpO2 100%   BMI 37.86 kg/m  Wt Readings from Last 3 Encounters:  03/17/22 (!) 311 lb (141.1 kg)  09/27/21 (!) 315 lb (142.9 kg)  09/21/21 (!) 317 lb (143.8 kg)       Assessment & Plan:   Problem List Items Addressed This Visit       Nervous and Auditory   Acute suppurative otitis media of left ear - Primary    Amoxicillin prescribed.  Recommend continuing to use steroid nasal spray.  Avoid submerging head in water for the next 2 weeks.  Avoid putting Q-tips in his ear in the future.  Follow-up if new or worsening symptoms or if not back to baseline when he finishes the antibiotic.      Relevant Medications   amoxicillin (AMOXIL) 875 MG tablet    I am having 11/19/21. Hector Lee start on amoxicillin. I am also having him maintain his multivitamin, timolol, latanoprost, Fish Oil, NON FORMULARY, naproxen, methocarbamol, NEOMYCIN-POLYMYXIN-HYDROCORTISONE, and Vitamin D (Ergocalciferol).  Meds ordered this encounter  Medications   amoxicillin (AMOXIL) 875 MG tablet    Sig: Take 1 tablet (875 mg total) by mouth 2 (two) times daily for 10 days.    Dispense:  20 tablet    Refill:  0    Order Specific Question:    Supervising Provider    Answer:   Hector Lee [[id number]]

## 2022-03-17 NOTE — Patient Instructions (Signed)
Take the oral antibiotic as prescribed. Continue using the steroid nasal spray.  Avoid submerging your head in water for the next 2 weeks.  Follow-up if you have any worsening or new symptoms or if you are not back to baseline when you finish the antibiotic.    Otitis Media, Adult  Otitis media occurs when there is inflammation and fluid in the middle ear with signs and symptoms of an acute infection. The middle ear is a part of the ear that contains bones for hearing as well as air that helps send sounds to the brain. When infected fluid builds up in this space, it causes pressure and can lead to an ear infection. The eustachian tube connects the middle ear to the back of the nose (nasopharynx) and normally allows air into the middle ear. If the eustachian tube becomes blocked, fluid can build up and become infected. What are the causes? This condition is caused by a blockage in the eustachian tube. This can be caused by mucus or by swelling of the tube. Problems that can cause a blockage include: A cold or other upper respiratory infection. Allergies. An irritant, such as tobacco smoke. Enlarged adenoids. The adenoids are areas of soft tissue located high in the back of the throat, behind the nose and the roof of the mouth. They are part of the body's defense system (immune system). A mass in the nasopharynx. Damage to the ear caused by pressure changes (barotrauma). What increases the risk? You are more likely to develop this condition if you: Smoke or are exposed to tobacco smoke. Have an opening in the roof of your mouth (cleft palate). Have gastroesophageal reflux. Have an immune system disorder. What are the signs or symptoms? Symptoms of this condition include: Ear pain. Fever. Decreased hearing. Tiredness (lethargy). Fluid leaking from the ear, if the eardrum is ruptured or has burst. Ringing in the ear. How is this diagnosed?  This condition is diagnosed with a physical  exam. During the exam, your health care provider will use an instrument called an otoscope to look in your ear and check for redness, swelling, and fluid. He or she will also ask about your symptoms. Your health care provider may also order tests, such as: A pneumatic otoscopy. This is a test to check the movement of the eardrum. It is done by squeezing a small amount of air into the ear. A tympanogram. This is a test that shows how well the eardrum moves in response to air pressure in the ear canal. It provides a graph for your health care provider to review. How is this treated? This condition can go away on its own within 3-5 days. But if the condition is caused by a bacterial infection and does not go away on its own, or if it keeps coming back, your health care provider may: Prescribe antibiotic medicine to treat the infection. Prescribe or recommend medicines to control pain. Follow these instructions at home: Take over-the-counter and prescription medicines only as told by your health care provider. If you were prescribed an antibiotic medicine, take it as told by your health care provider. Do not stop taking the antibiotic even if you start to feel better. Keep all follow-up visits. This is important. Contact a health care provider if: You have bleeding from your nose. There is a lump on your neck. You are not feeling better in 5 days. You feel worse instead of better. Get help right away if: You have severe pain that  is not controlled with medicine. You have swelling, redness, or pain around your ear. You have stiffness in your neck. A part of your face is not moving (paralyzed). The bone behind your ear (mastoid bone) is tender when you touch it. You develop a severe headache. Summary Otitis media is redness, soreness, and swelling of the middle ear, usually resulting in pain and decreased hearing. This condition can go away on its own within 3-5 days. If the problem does not go  away in 3-5 days, your health care provider may give you medicines to treat the infection. If you were prescribed an antibiotic medicine, take it as told by your health care provider. Follow all instructions that were given to you by your health care provider. This information is not intended to replace advice given to you by your health care provider. Make sure you discuss any questions you have with your health care provider. Document Revised: 12/07/2020 Document Reviewed: 12/07/2020 Elsevier Patient Education  2023 [employer].

## 2022-03-17 NOTE — Assessment & Plan Note (Signed)
Amoxicillin prescribed.  Recommend continuing to use steroid nasal spray.  Avoid submerging head in water for the next 2 weeks.  Avoid putting Q-tips in his ear in the future.  Follow-up if new or worsening symptoms or if not back to baseline when he finishes the antibiotic.

## 2022-03-29 DIAGNOSIS — H60333 Swimmer's ear, bilateral: Secondary | ICD-10-CM | POA: Diagnosis not present

## 2022-03-29 DIAGNOSIS — H9211 Otorrhea, right ear: Secondary | ICD-10-CM | POA: Diagnosis not present

## 2022-04-13 DIAGNOSIS — H60333 Swimmer's ear, bilateral: Secondary | ICD-10-CM | POA: Diagnosis not present

## 2022-04-13 NOTE — Progress Notes (Signed)
 Otolaryngology Return Patient Note  Subjective: [name] is a 51 y.o. male seen in follow up today for severe bilateral AOE. Improving. Stopped cortisporin drops after 10 days. Good about water precautions.   History reviewed. No pertinent past medical history. Past Surgical History:  Procedure Laterality Date  . FUNCTIONAL ENDOSCOPIC SINUS SURGERY Right 02/17/2016   Procedure: ENDOSCOPIC SINUS SURGERY  (FESS);  Surgeon: Hector Jakie Ill, MD;  Location: [facility] MAIN OR;  Service: ENT;  Laterality: Right;   Family History  Problem Relation Age of Onset  . Diabetes Maternal Grandmother   . Kidney failure Maternal Grandmother   . Allergic rhinitis Neg Hx    Social History   Socioeconomic History  . Marital status: Single    Spouse name: Not on file  . Number of children: Not on file  . Years of education: Not on file  . Highest education level: Not on file  Occupational History  . Not on file  Tobacco Use  . Smoking status: Never  . Smokeless tobacco: Never  Substance and Sexual Activity  . Alcohol use: Yes    Comment: socially  . Drug use: No  . Sexual activity: Not on file  Other Topics Concern  . Not on file  Social History Narrative  . Not on file   Social Determinants of Health   Financial Resource Strain: Not on file  Food Insecurity: Not on file  Transportation Needs: Not on file  Physical Activity: Not on file  Stress: Not on file  Social Connections: Not on file  Housing Stability: Not on file   Allergies  Allergen Reactions  . Shellfish Containing Products Anaphylaxis (ALLERGY)   Updated Medication List:        amoxicillin -clavulanate (AUGMENTIN ) 875-125 mg per tablet   Sig: Take 1 tablet on the evening of 02/23/16 then take 1 tablet twice daily from 02/24/16 until 03/16/16.   docosahexaenoic acid-epa 120-180 mg Cap   Sig - Route: Take 1 capsule by mouth daily. - Oral   Class: Historical Med   docusate sodium (COLACE) 100 MG capsule   Sig -  Route: Take 1 capsule (100 mg total) by mouth 2 (two) times daily as needed for Constipation. - Oral   Class: OTC   ergocalciferol  (VITAMIN D2) 1,250 mcg (50,000 unit) capsule   Sig - Route: Take 1 capsule (50,000 Units total) by mouth every 7 days. - Oral   Class: Historical Med   latanoprost (XALATAN) 0.005 % ophthalmic solution   Sig:     Class: Historical Med   multivitamin (MULTIVITAMIN) per tablet   Sig - Route: Take 1 tablet by mouth daily. - Oral   Class: Historical Med   sodium chloride  (OCEAN) 0.65 % nasal spray   Sig - Route: 1 spray by Nasal route as needed for Congestion. - Nasal   timolol  (TIMOPTIC ) 0.5 % ophthalmic solution   Sig:     Class: Historical Med   VIT C/E/ZN/COPPR/LUTEIN/ZEAXAN (OCUVITE LUTEIN AND ZEAXANTHIN ORAL)   Sig - Route: Take by mouth. - Oral   Class: Historical Med       ROS A complete review of systems was conducted and was negative except as stated in the HPI.   Objective: There were no vitals filed for this visit. Physical Exam:   General Normocephalic, Awake, Alert  Eyes PERRL, no scleral icterus or conjunctival hemorrhage. EOMI.  Ears  see below  Cardio-vascular No cyanosis,  regular rate  Pulmonary No audible stridor, Breathing easily with  no labor.  Neuro Symmetric facial movement.  Tongue protrudes in midline.  Psychiatry Appropriate affect and mood for clinic visit.  Skin No scars or lesions on face or neck.       Otolaryngology Procedure: cerumen removal,  Bilateral Findings: EAC with moderate erythema on the right side.  A little mild white desquamated debris.  Wound culture was obtained.  TM intact though with some inflammation more laterally.  No middle ear effusion.  External auditory canal the left side with improved erythema, better than the right side.  TM again slightly thickened but no middle ear effusion. Details: The right ear was examined under the microscope and using binocular vision, #5 suction- the ceruminous  debris was removed. The ear was then examined with findings noted above. The patient tolerated this well and there were no complications. All instrumentation was then removed. An identical procedure was repeated on the opposite side.   Assessment:  My impression is that Hector Lee has  1. Acute swimmer's ear of both sides   .    Plan:  We will follow wound culture results. Resume Cortisporin drops for 1 week. Continue water precautions for 2 more weeks.  Electronically signed by: Alan Idell Plant, MD 04/13/2022 9:41 AM      Electronically signed by: Alan Idell Plant, MD 04/13/22 [phone]

## 2022-07-13 DIAGNOSIS — H2512 Age-related nuclear cataract, left eye: Secondary | ICD-10-CM | POA: Diagnosis not present

## 2022-07-13 DIAGNOSIS — H40012 Open angle with borderline findings, low risk, left eye: Secondary | ICD-10-CM | POA: Diagnosis not present

## 2022-07-13 DIAGNOSIS — H3411 Central retinal artery occlusion, right eye: Secondary | ICD-10-CM | POA: Diagnosis not present

## 2022-07-13 DIAGNOSIS — H43392 Other vitreous opacities, left eye: Secondary | ICD-10-CM | POA: Diagnosis not present

## 2022-10-06 DIAGNOSIS — H6983 Other specified disorders of Eustachian tube, bilateral: Secondary | ICD-10-CM | POA: Diagnosis not present

## 2022-10-06 DIAGNOSIS — J310 Chronic rhinitis: Secondary | ICD-10-CM | POA: Diagnosis not present

## 2022-10-06 DIAGNOSIS — J343 Hypertrophy of nasal turbinates: Secondary | ICD-10-CM | POA: Diagnosis not present

## 2022-10-23 DIAGNOSIS — H6122 Impacted cerumen, left ear: Secondary | ICD-10-CM | POA: Diagnosis not present

## 2022-10-23 DIAGNOSIS — H60509 Unspecified acute noninfective otitis externa, unspecified ear: Secondary | ICD-10-CM | POA: Diagnosis not present

## 2022-11-24 DIAGNOSIS — J310 Chronic rhinitis: Secondary | ICD-10-CM | POA: Diagnosis not present

## 2022-11-24 DIAGNOSIS — H6983 Other specified disorders of Eustachian tube, bilateral: Secondary | ICD-10-CM | POA: Diagnosis not present

## 2022-11-24 DIAGNOSIS — H60333 Swimmer's ear, bilateral: Secondary | ICD-10-CM | POA: Diagnosis not present

## 2022-11-24 DIAGNOSIS — J343 Hypertrophy of nasal turbinates: Secondary | ICD-10-CM | POA: Diagnosis not present

## 2023-01-09 DIAGNOSIS — H40012 Open angle with borderline findings, low risk, left eye: Secondary | ICD-10-CM | POA: Diagnosis not present

## 2023-05-23 DIAGNOSIS — J343 Hypertrophy of nasal turbinates: Secondary | ICD-10-CM | POA: Diagnosis not present

## 2023-05-23 DIAGNOSIS — J310 Chronic rhinitis: Secondary | ICD-10-CM | POA: Diagnosis not present

## 2023-05-23 DIAGNOSIS — H6983 Other specified disorders of Eustachian tube, bilateral: Secondary | ICD-10-CM | POA: Diagnosis not present

## 2023-07-17 DIAGNOSIS — H43392 Other vitreous opacities, left eye: Secondary | ICD-10-CM | POA: Diagnosis not present

## 2023-07-17 DIAGNOSIS — H2512 Age-related nuclear cataract, left eye: Secondary | ICD-10-CM | POA: Diagnosis not present

## 2023-07-17 DIAGNOSIS — H40012 Open angle with borderline findings, low risk, left eye: Secondary | ICD-10-CM | POA: Diagnosis not present

## 2023-07-17 DIAGNOSIS — H3411 Central retinal artery occlusion, right eye: Secondary | ICD-10-CM | POA: Diagnosis not present

## 2024-05-28 ENCOUNTER — Encounter (INDEPENDENT_AMBULATORY_CARE_PROVIDER_SITE_OTHER): Payer: Self-pay | Admitting: Otolaryngology

## 2024-05-28 ENCOUNTER — Ambulatory Visit (INDEPENDENT_AMBULATORY_CARE_PROVIDER_SITE_OTHER): Payer: BC Managed Care – PPO | Admitting: Otolaryngology

## 2024-05-28 VITALS — BP 153/80 | HR 70 | Temp 97.6°F

## 2024-05-28 DIAGNOSIS — J310 Chronic rhinitis: Secondary | ICD-10-CM | POA: Diagnosis not present

## 2024-05-28 DIAGNOSIS — J343 Hypertrophy of nasal turbinates: Secondary | ICD-10-CM | POA: Diagnosis not present

## 2024-05-28 DIAGNOSIS — R0981 Nasal congestion: Secondary | ICD-10-CM | POA: Diagnosis not present

## 2024-05-28 DIAGNOSIS — H6123 Impacted cerumen, bilateral: Secondary | ICD-10-CM

## 2024-05-28 DIAGNOSIS — H6983 Other specified disorders of Eustachian tube, bilateral: Secondary | ICD-10-CM

## 2024-05-28 DIAGNOSIS — H6980 Other specified disorders of Eustachian tube, unspecified ear: Secondary | ICD-10-CM | POA: Diagnosis not present

## 2024-05-28 NOTE — Progress Notes (Unsigned)
 Patient ID: Hector Lee, male   DOB: Oct 20, 1970, 53 y.o.   MRN: [id number]  Follow-up: Eustachian tube dysfunction, chronic nasal congestion  HPI: 9/24 The patient is a 53 year old male who returns today for his follow-up evaluation.  The patient has a history of eustachian tube dysfunction and chronic nasal congestion.  At his last visit 6 months ago, he was noted to have nasal mucosal congestion and bilateral inferior turbinate hypertrophy.  His eustachian tube dysfunction had improved with the use of Flonase  nasal spray.  The patient returns today complaining of occasional nasal congestion.  Currently he denies any otalgia, otorrhea, vertigo, or change in his hearing.  The clogging sensation in his ears have significantly improved.  He also denies any facial pain, fever, or visual change.   Objective Objective note General: Communicates without difficulty, well nourished, no acute distress. Head: Normocephalic, no evidence injury, no tenderness, facial buttresses intact without stepoff. Face/sinus: No tenderness to palpation and percussion. Facial movement is normal and symmetric. Eyes: PERRL, EOMI. No scleral icterus, conjunctivae clear. Neuro: CN II exam reveals vision grossly intact.  No nystagmus at any point of gaze. Ears: Auricles well formed without lesions.  Ear canals are intact without mass or lesion.  No erythema or edema is appreciated.  The TMs are intact without fluid. Nose: External evaluation reveals normal support and skin without lesions.  Dorsum is intact.  Anterior rhinoscopy reveals congested mucosa over anterior aspect of inferior turbinates and intact septum.  No purulence noted. Oral:  Oral cavity and oropharynx are intact, symmetric, without erythema or edema.  Mucosa is moist without lesions. Neck: Full range of motion without pain.  There is no significant lymphadenopathy.  No masses palpable.  Thyroid  bed within normal limits to palpation.  Parotid glands and  submandibular glands equal bilaterally without mass.  Trachea is midline. Neuro:  CN 2-12 grossly intact. Gait normal.     Procedure:  Flexible Nasal Endoscopy: Description: Risks, benefits, and alternatives of flexible endoscopy were explained to the patient.  Specific mention was made of the risk of throat numbness with difficulty swallowing, possible bleeding from the nose and mouth, and pain from the procedure.  The patient gave oral consent to proceed.  The flexible scope was inserted into the right nasal cavity.  Endoscopy of the interior nasal cavity, superior, inferior, and middle meatus was performed. The sphenoid-ethmoid recess was examined. Edematous mucosa was noted.  No polyp, mass, or lesion was appreciated. Olfactory cleft was clear.  Nasopharynx was clear.  Turbinates were hypertrophied but without mass.  The procedure was repeated on the contralateral side with similar findings.  The patient tolerated the procedure well.    Observations Functional status No functional status recorded  Cognitive status No cognitive status recorded  Assessment Assessment note 1.  The patient's ear canals, tympanic membranes, and middle ear spaces are noted to be normal.  2.  Clinical improved eustachian tube dysfunction.  3.  Chronic rhinitis with nasal mucosal congestion and bilateral inferior turbinate hypertrophy.   Screenings/Interventions/Assessments No screenings/interventions/assessments recorded  Diagnoses attached to encounter No diagnoses attached  Plan Plan note 1.  The physical exam and nasal endoscopy findings are reviewed with the patient.    2.  Continue Flonase  nasal spray 2 sprays each nostril daily.  3.  Valsalva exercise multiple times a day.  4.  The patient will return for reevaluation in 12 months, sooner if needed.

## 2024-05-29 DIAGNOSIS — H6983 Other specified disorders of Eustachian tube, bilateral: Secondary | ICD-10-CM | POA: Insufficient documentation

## 2024-05-29 DIAGNOSIS — J343 Hypertrophy of nasal turbinates: Secondary | ICD-10-CM | POA: Insufficient documentation

## 2024-05-29 DIAGNOSIS — J310 Chronic rhinitis: Secondary | ICD-10-CM | POA: Insufficient documentation

## 2024-05-29 DIAGNOSIS — H6123 Impacted cerumen, bilateral: Secondary | ICD-10-CM | POA: Insufficient documentation
